# Patient Record
Sex: Female | Born: 1940 | Race: Black or African American | Hispanic: No | Smoking: Former smoker
Health system: Southern US, Community
[De-identification: ages and names within clinical notes are randomized; demographics above are authoritative.]

## PROBLEM LIST (undated history)

## (undated) DIAGNOSIS — I509 Heart failure, unspecified: Secondary | ICD-10-CM

## (undated) DIAGNOSIS — E785 Hyperlipidemia, unspecified: Secondary | ICD-10-CM

## (undated) DIAGNOSIS — E119 Type 2 diabetes mellitus without complications: Secondary | ICD-10-CM

## (undated) DIAGNOSIS — I10 Essential (primary) hypertension: Secondary | ICD-10-CM

## (undated) DIAGNOSIS — I639 Cerebral infarction, unspecified: Secondary | ICD-10-CM

## (undated) HISTORY — DX: Type 2 diabetes mellitus without complications: E11.9

## (undated) HISTORY — DX: Heart failure, unspecified: I50.9

## (undated) HISTORY — DX: Cerebral infarction, unspecified: I63.9

## (undated) HISTORY — DX: Hyperlipidemia, unspecified: E78.5

## (undated) HISTORY — DX: Essential (primary) hypertension: I10

---

## 2001-12-25 ENCOUNTER — Ambulatory Visit (HOSPITAL_COMMUNITY): Admission: RE | Admit: 2001-12-25 | Discharge: 2001-12-25 | Payer: Self-pay

## 2001-12-25 ENCOUNTER — Encounter: Payer: Self-pay | Admitting: Family Medicine

## 2003-12-12 ENCOUNTER — Ambulatory Visit (HOSPITAL_COMMUNITY): Admission: RE | Admit: 2003-12-12 | Discharge: 2003-12-12 | Payer: Self-pay | Admitting: Family Medicine

## 2003-12-28 ENCOUNTER — Other Ambulatory Visit: Payer: Self-pay

## 2005-01-18 ENCOUNTER — Ambulatory Visit (HOSPITAL_COMMUNITY): Admission: RE | Admit: 2005-01-18 | Discharge: 2005-01-18 | Payer: Self-pay

## 2005-02-03 ENCOUNTER — Ambulatory Visit (HOSPITAL_COMMUNITY): Admission: RE | Admit: 2005-02-03 | Discharge: 2005-02-03 | Payer: Self-pay | Admitting: Hematology and Oncology

## 2006-01-25 ENCOUNTER — Ambulatory Visit (HOSPITAL_COMMUNITY): Admission: RE | Admit: 2006-01-25 | Discharge: 2006-01-25 | Payer: Self-pay | Admitting: Family Medicine

## 2007-01-30 ENCOUNTER — Ambulatory Visit (HOSPITAL_COMMUNITY): Admission: RE | Admit: 2007-01-30 | Discharge: 2007-01-30 | Payer: Self-pay | Admitting: Family Medicine

## 2007-08-23 ENCOUNTER — Ambulatory Visit (HOSPITAL_COMMUNITY): Admission: RE | Admit: 2007-08-23 | Discharge: 2007-08-23 | Payer: Self-pay | Admitting: Family Medicine

## 2007-08-23 ENCOUNTER — Encounter: Payer: Self-pay | Admitting: Orthopedic Surgery

## 2007-08-28 ENCOUNTER — Ambulatory Visit: Payer: Self-pay | Admitting: Orthopedic Surgery

## 2007-08-28 DIAGNOSIS — Z8679 Personal history of other diseases of the circulatory system: Secondary | ICD-10-CM | POA: Insufficient documentation

## 2007-08-28 DIAGNOSIS — S8263XA Displaced fracture of lateral malleolus of unspecified fibula, initial encounter for closed fracture: Secondary | ICD-10-CM | POA: Insufficient documentation

## 2007-08-28 DIAGNOSIS — E119 Type 2 diabetes mellitus without complications: Secondary | ICD-10-CM

## 2007-10-02 ENCOUNTER — Ambulatory Visit: Payer: Self-pay | Admitting: Orthopedic Surgery

## 2007-11-27 ENCOUNTER — Ambulatory Visit: Payer: Self-pay | Admitting: Orthopedic Surgery

## 2008-02-01 ENCOUNTER — Ambulatory Visit (HOSPITAL_COMMUNITY): Admission: RE | Admit: 2008-02-01 | Discharge: 2008-02-01 | Payer: Self-pay | Admitting: Family Medicine

## 2009-02-03 ENCOUNTER — Ambulatory Visit (HOSPITAL_COMMUNITY): Admission: RE | Admit: 2009-02-03 | Discharge: 2009-02-03 | Payer: Self-pay | Admitting: Family Medicine

## 2010-02-12 ENCOUNTER — Ambulatory Visit (HOSPITAL_COMMUNITY): Admission: RE | Admit: 2010-02-12 | Discharge: 2010-02-12 | Payer: Self-pay | Admitting: Family Medicine

## 2010-04-09 ENCOUNTER — Ambulatory Visit: Payer: Self-pay | Admitting: Cardiology

## 2010-04-09 ENCOUNTER — Encounter (INDEPENDENT_AMBULATORY_CARE_PROVIDER_SITE_OTHER): Payer: Self-pay | Admitting: Internal Medicine

## 2010-04-10 ENCOUNTER — Encounter (INDEPENDENT_AMBULATORY_CARE_PROVIDER_SITE_OTHER): Payer: Self-pay | Admitting: Internal Medicine

## 2010-04-18 ENCOUNTER — Emergency Department (HOSPITAL_COMMUNITY): Admission: EM | Admit: 2010-04-18 | Discharge: 2010-04-18 | Payer: Self-pay | Admitting: Emergency Medicine

## 2010-04-22 ENCOUNTER — Ambulatory Visit (HOSPITAL_COMMUNITY): Admission: RE | Admit: 2010-04-22 | Discharge: 2010-04-22 | Payer: Self-pay | Admitting: Interventional Radiology

## 2010-04-23 ENCOUNTER — Inpatient Hospital Stay (HOSPITAL_COMMUNITY): Admission: EM | Admit: 2010-04-23 | Discharge: 2010-04-26 | Payer: Self-pay | Admitting: Emergency Medicine

## 2010-06-11 ENCOUNTER — Emergency Department (HOSPITAL_COMMUNITY): Admission: EM | Admit: 2010-06-11 | Discharge: 2010-06-11 | Payer: Self-pay | Admitting: Emergency Medicine

## 2010-06-14 ENCOUNTER — Inpatient Hospital Stay (HOSPITAL_COMMUNITY): Admission: AC | Admit: 2010-06-14 | Discharge: 2010-06-26 | Payer: Self-pay

## 2010-06-15 ENCOUNTER — Ambulatory Visit: Payer: Self-pay | Admitting: Vascular Surgery

## 2010-06-15 ENCOUNTER — Encounter (INDEPENDENT_AMBULATORY_CARE_PROVIDER_SITE_OTHER): Payer: Self-pay | Admitting: Internal Medicine

## 2010-06-24 ENCOUNTER — Encounter (INDEPENDENT_AMBULATORY_CARE_PROVIDER_SITE_OTHER): Payer: Self-pay | Admitting: Internal Medicine

## 2010-07-19 ENCOUNTER — Emergency Department: Payer: Self-pay | Admitting: Internal Medicine

## 2010-07-20 ENCOUNTER — Ambulatory Visit (HOSPITAL_COMMUNITY): Admission: RE | Admit: 2010-07-20 | Discharge: 2010-07-20 | Payer: Self-pay | Admitting: Geriatric Medicine

## 2010-07-21 ENCOUNTER — Emergency Department (HOSPITAL_COMMUNITY): Admission: EM | Admit: 2010-07-21 | Discharge: 2010-07-21 | Payer: Self-pay | Admitting: Emergency Medicine

## 2010-09-24 ENCOUNTER — Inpatient Hospital Stay (HOSPITAL_COMMUNITY): Admission: EM | Admit: 2010-09-24 | Discharge: 2010-04-14 | Payer: Self-pay | Admitting: Emergency Medicine

## 2010-10-31 ENCOUNTER — Emergency Department (HOSPITAL_COMMUNITY)
Admission: EM | Admit: 2010-10-31 | Discharge: 2010-10-31 | Payer: Self-pay | Source: Home / Self Care | Admitting: Emergency Medicine

## 2010-11-07 ENCOUNTER — Encounter: Payer: Self-pay | Admitting: Geriatric Medicine

## 2010-11-15 ENCOUNTER — Emergency Department (HOSPITAL_COMMUNITY)
Admission: EM | Admit: 2010-11-15 | Discharge: 2010-11-15 | Payer: Self-pay | Source: Home / Self Care | Admitting: Emergency Medicine

## 2010-11-15 LAB — COMPREHENSIVE METABOLIC PANEL
ALT: 73 U/L — ABNORMAL HIGH (ref 0–35)
Albumin: 3.3 g/dL — ABNORMAL LOW (ref 3.5–5.2)
Alkaline Phosphatase: 60 U/L (ref 39–117)
BUN: 37 mg/dL — ABNORMAL HIGH (ref 6–23)
CO2: 29 mEq/L (ref 19–32)
GFR calc non Af Amer: 47 mL/min — ABNORMAL LOW (ref >60)
Potassium: 4.1 mEq/L (ref 3.5–5.1)
Sodium: 138 mEq/L (ref 135–145)
Total Protein: 6.8 g/dL (ref 6.0–8.3)

## 2010-11-15 LAB — DIFFERENTIAL
Basophils Absolute: 0 10*3/uL (ref 0.0–0.1)
Eosinophils Absolute: 0.2 10*3/uL (ref 0.0–0.7)
Lymphs Abs: 1.8 10*3/uL (ref 0.7–4.0)
Monocytes Absolute: 0.6 10*3/uL (ref 0.1–1.0)
Monocytes Relative: 9 % (ref 3–12)
Neutrophils Relative %: 59 % (ref 43–77)

## 2010-11-15 LAB — CBC
Hemoglobin: 10.5 g/dL — ABNORMAL LOW (ref 12.0–15.0)
MCHC: 32.8 g/dL (ref 30.0–36.0)
MCV: 77.7 fL — ABNORMAL LOW (ref 78.0–100.0)
RBC: 4.12 MIL/uL (ref 3.87–5.11)
WBC: 6.6 10*3/uL (ref 4.0–10.5)

## 2010-11-15 LAB — LIPASE, BLOOD: Lipase: 64 U/L — ABNORMAL HIGH (ref 11–59)

## 2010-12-02 ENCOUNTER — Ambulatory Visit: Payer: Self-pay

## 2010-12-31 LAB — COMPREHENSIVE METABOLIC PANEL
ALT: 25 U/L (ref 0–35)
Albumin: 2.7 g/dL — ABNORMAL LOW (ref 3.5–5.2)
Alkaline Phosphatase: 80 U/L (ref 39–117)
Glucose, Bld: 185 mg/dL — ABNORMAL HIGH (ref 70–99)
Potassium: 4.1 mEq/L (ref 3.5–5.1)
Sodium: 144 mEq/L (ref 135–145)
Total Protein: 6.6 g/dL (ref 6.0–8.3)

## 2010-12-31 LAB — BASIC METABOLIC PANEL
BUN: 34 mg/dL — ABNORMAL HIGH (ref 6–23)
BUN: 50 mg/dL — ABNORMAL HIGH (ref 6–23)
CO2: 28 mEq/L (ref 19–32)
CO2: 28 mEq/L (ref 19–32)
CO2: 29 mEq/L (ref 19–32)
Calcium: 9.2 mg/dL (ref 8.4–10.5)
Calcium: 9.2 mg/dL (ref 8.4–10.5)
Calcium: 9.5 mg/dL (ref 8.4–10.5)
Chloride: 109 mEq/L (ref 96–112)
Chloride: 109 mEq/L (ref 96–112)
Chloride: 109 mEq/L (ref 96–112)
Creatinine, Ser: 1.53 mg/dL — ABNORMAL HIGH (ref 0.4–1.2)
Creatinine, Ser: 1.62 mg/dL — ABNORMAL HIGH (ref 0.4–1.2)
GFR calc Af Amer: 38 mL/min — ABNORMAL LOW (ref >60)
GFR calc Af Amer: 51 mL/min — ABNORMAL LOW (ref >60)
GFR calc Af Amer: >60 mL/min (ref >60)
GFR calc non Af Amer: 34 mL/min — ABNORMAL LOW (ref >60)
GFR calc non Af Amer: 50 mL/min — ABNORMAL LOW (ref >60)
Glucose, Bld: 247 mg/dL — ABNORMAL HIGH (ref 70–99)
Glucose, Bld: 263 mg/dL — ABNORMAL HIGH (ref 70–99)
Potassium: 3.8 mEq/L (ref 3.5–5.1)
Potassium: 3.9 mEq/L (ref 3.5–5.1)
Potassium: 4 mEq/L (ref 3.5–5.1)
Potassium: 4 mEq/L (ref 3.5–5.1)
Sodium: 147 mEq/L — ABNORMAL HIGH (ref 135–145)
Sodium: 149 mEq/L — ABNORMAL HIGH (ref 135–145)
Sodium: 150 mEq/L — ABNORMAL HIGH (ref 135–145)

## 2010-12-31 LAB — GLUCOSE, CAPILLARY
Glucose-Capillary: 139 mg/dL — ABNORMAL HIGH (ref 70–99)
Glucose-Capillary: 144 mg/dL — ABNORMAL HIGH (ref 70–99)
Glucose-Capillary: 148 mg/dL — ABNORMAL HIGH (ref 70–99)
Glucose-Capillary: 155 mg/dL — ABNORMAL HIGH (ref 70–99)
Glucose-Capillary: 157 mg/dL — ABNORMAL HIGH (ref 70–99)
Glucose-Capillary: 158 mg/dL — ABNORMAL HIGH (ref 70–99)
Glucose-Capillary: 173 mg/dL — ABNORMAL HIGH (ref 70–99)
Glucose-Capillary: 174 mg/dL — ABNORMAL HIGH (ref 70–99)
Glucose-Capillary: 177 mg/dL — ABNORMAL HIGH (ref 70–99)
Glucose-Capillary: 183 mg/dL — ABNORMAL HIGH (ref 70–99)
Glucose-Capillary: 198 mg/dL — ABNORMAL HIGH (ref 70–99)
Glucose-Capillary: 206 mg/dL — ABNORMAL HIGH (ref 70–99)
Glucose-Capillary: 208 mg/dL — ABNORMAL HIGH (ref 70–99)
Glucose-Capillary: 221 mg/dL — ABNORMAL HIGH (ref 70–99)
Glucose-Capillary: 223 mg/dL — ABNORMAL HIGH (ref 70–99)
Glucose-Capillary: 226 mg/dL — ABNORMAL HIGH (ref 70–99)
Glucose-Capillary: 232 mg/dL — ABNORMAL HIGH (ref 70–99)
Glucose-Capillary: 232 mg/dL — ABNORMAL HIGH (ref 70–99)
Glucose-Capillary: 232 mg/dL — ABNORMAL HIGH (ref 70–99)
Glucose-Capillary: 261 mg/dL — ABNORMAL HIGH (ref 70–99)
Glucose-Capillary: 263 mg/dL — ABNORMAL HIGH (ref 70–99)
Glucose-Capillary: 267 mg/dL — ABNORMAL HIGH (ref 70–99)
Glucose-Capillary: 268 mg/dL — ABNORMAL HIGH (ref 70–99)
Glucose-Capillary: 292 mg/dL — ABNORMAL HIGH (ref 70–99)
Glucose-Capillary: 319 mg/dL — ABNORMAL HIGH (ref 70–99)
Glucose-Capillary: 342 mg/dL — ABNORMAL HIGH (ref 70–99)

## 2010-12-31 LAB — CBC
HCT: 33.1 % — ABNORMAL LOW (ref 36.0–46.0)
HCT: 36 % (ref 36.0–46.0)
HCT: 36.2 % (ref 36.0–46.0)
HCT: 36.4 % (ref 36.0–46.0)
HCT: 38.5 % (ref 36.0–46.0)
Hemoglobin: 10.4 g/dL — ABNORMAL LOW (ref 12.0–15.0)
Hemoglobin: 11.7 g/dL — ABNORMAL LOW (ref 12.0–15.0)
MCH: 24.1 pg — ABNORMAL LOW (ref 26.0–34.0)
MCH: 24.2 pg — ABNORMAL LOW (ref 26.0–34.0)
MCHC: 30.7 g/dL (ref 30.0–36.0)
MCHC: 31.4 g/dL (ref 30.0–36.0)
MCV: 77.6 fL — ABNORMAL LOW (ref 78.0–100.0)
MCV: 79 fL (ref 78.0–100.0)
Platelets: 254 10*3/uL (ref 150–400)
Platelets: 271 10*3/uL (ref 150–400)
Platelets: 291 10*3/uL (ref 150–400)
RBC: 4.11 MIL/uL (ref 3.87–5.11)
RBC: 4.31 MIL/uL (ref 3.87–5.11)
RBC: 4.82 MIL/uL (ref 3.87–5.11)
RDW: 18 % — ABNORMAL HIGH (ref 11.5–15.5)
RDW: 18.7 % — ABNORMAL HIGH (ref 11.5–15.5)
RDW: 18.7 % — ABNORMAL HIGH (ref 11.5–15.5)
WBC: 10.7 10*3/uL — ABNORMAL HIGH (ref 4.0–10.5)
WBC: 11.6 10*3/uL — ABNORMAL HIGH (ref 4.0–10.5)
WBC: 9.7 10*3/uL (ref 4.0–10.5)

## 2010-12-31 LAB — IRON AND TIBC
Saturation Ratios: 11 % — ABNORMAL LOW (ref 20–55)
TIBC: 238 ug/dL — ABNORMAL LOW (ref 250–470)

## 2010-12-31 LAB — FOLATE: Folate: >20 ng/mL

## 2010-12-31 LAB — BRAIN NATRIURETIC PEPTIDE
Pro B Natriuretic peptide (BNP): >3200 pg/mL — ABNORMAL HIGH (ref 0.0–100.0)
Pro B Natriuretic peptide (BNP): >3200 pg/mL — ABNORMAL HIGH (ref 0.0–100.0)

## 2010-12-31 LAB — PROTIME-INR: Prothrombin Time: 15 seconds (ref 11.6–15.2)

## 2011-01-01 LAB — GLUCOSE, CAPILLARY
Glucose-Capillary: 113 mg/dL — ABNORMAL HIGH (ref 70–99)
Glucose-Capillary: 121 mg/dL — ABNORMAL HIGH (ref 70–99)
Glucose-Capillary: 128 mg/dL — ABNORMAL HIGH (ref 70–99)
Glucose-Capillary: 136 mg/dL — ABNORMAL HIGH (ref 70–99)
Glucose-Capillary: 138 mg/dL — ABNORMAL HIGH (ref 70–99)
Glucose-Capillary: 152 mg/dL — ABNORMAL HIGH (ref 70–99)
Glucose-Capillary: 166 mg/dL — ABNORMAL HIGH (ref 70–99)
Glucose-Capillary: 166 mg/dL — ABNORMAL HIGH (ref 70–99)
Glucose-Capillary: 184 mg/dL — ABNORMAL HIGH (ref 70–99)
Glucose-Capillary: 187 mg/dL — ABNORMAL HIGH (ref 70–99)
Glucose-Capillary: 252 mg/dL — ABNORMAL HIGH (ref 70–99)

## 2011-01-01 LAB — CBC
HCT: 27.4 % — ABNORMAL LOW (ref 36.0–46.0)
HCT: 30 % — ABNORMAL LOW (ref 36.0–46.0)
Hemoglobin: 8.5 g/dL — ABNORMAL LOW (ref 12.0–15.0)
Hemoglobin: 9.2 g/dL — ABNORMAL LOW (ref 12.0–15.0)
Hemoglobin: 9.9 g/dL — ABNORMAL LOW (ref 12.0–15.0)
MCH: 23.8 pg — ABNORMAL LOW (ref 26.0–34.0)
MCH: 24.1 pg — ABNORMAL LOW (ref 26.0–34.0)
MCHC: 31.6 g/dL (ref 30.0–36.0)
MCHC: 31 g/dL (ref 30.0–36.0)
MCHC: 31 g/dL (ref 30.0–36.0)
MCV: 77.8 fL — ABNORMAL LOW (ref 78.0–100.0)
MCV: 78 fL (ref 78.0–100.0)
Platelets: 159 10*3/uL (ref 150–400)
Platelets: 160 10*3/uL (ref 150–400)
Platelets: 226 10*3/uL (ref 150–400)
RBC: 3.82 MIL/uL — ABNORMAL LOW (ref 3.87–5.11)
RDW: 18.2 % — ABNORMAL HIGH (ref 11.5–15.5)
RDW: 17.5 % — ABNORMAL HIGH (ref 11.5–15.5)
RDW: 17.7 % — ABNORMAL HIGH (ref 11.5–15.5)
RDW: 17.8 % — ABNORMAL HIGH (ref 11.5–15.5)
WBC: 7.7 10*3/uL (ref 4.0–10.5)
WBC: 7.4 10*3/uL (ref 4.0–10.5)
WBC: 8.4 10*3/uL (ref 4.0–10.5)

## 2011-01-01 LAB — COMPREHENSIVE METABOLIC PANEL
ALT: 14 U/L (ref 0–35)
Albumin: 3.2 g/dL — ABNORMAL LOW (ref 3.5–5.2)
BUN: 23 mg/dL (ref 6–23)
CO2: 22 meq/L (ref 19–32)
CO2: 23 mEq/L (ref 19–32)
Calcium: 8.6 mg/dL (ref 8.4–10.5)
Chloride: 106 mEq/L (ref 96–112)
Chloride: 107 meq/L (ref 96–112)
Creatinine, Ser: 1.42 mg/dL — ABNORMAL HIGH (ref 0.4–1.2)
GFR calc non Af Amer: 37 mL/min — ABNORMAL LOW (ref >60)
GFR calc non Af Amer: 39 mL/min — ABNORMAL LOW (ref >60)
Glucose, Bld: 120 mg/dL — ABNORMAL HIGH (ref 70–99)
Glucose, Bld: 140 mg/dL — ABNORMAL HIGH (ref 70–99)
Sodium: 136 meq/L (ref 135–145)
Total Bilirubin: 0.4 mg/dL (ref 0.3–1.2)
Total Bilirubin: 0.6 mg/dL (ref 0.3–1.2)

## 2011-01-01 LAB — TROPONIN I: Troponin I: 0.01 ng/mL (ref 0.00–0.06)

## 2011-01-01 LAB — BASIC METABOLIC PANEL
BUN: 23 mg/dL (ref 6–23)
BUN: 35 mg/dL — ABNORMAL HIGH (ref 6–23)
CO2: 20 mEq/L (ref 19–32)
Calcium: 9.2 mg/dL (ref 8.4–10.5)
Calcium: 8.8 mg/dL (ref 8.4–10.5)
Chloride: 115 mEq/L — ABNORMAL HIGH (ref 96–112)
Creatinine, Ser: 1.41 mg/dL — ABNORMAL HIGH (ref 0.4–1.2)
GFR calc Af Amer: 39 mL/min — ABNORMAL LOW (ref >60)
GFR calc non Af Amer: 37 mL/min — ABNORMAL LOW (ref >60)
GFR calc non Af Amer: 33 mL/min — ABNORMAL LOW (ref >60)
GFR calc non Af Amer: 37 mL/min — ABNORMAL LOW (ref >60)
Glucose, Bld: 140 mg/dL — ABNORMAL HIGH (ref 70–99)
Glucose, Bld: 181 mg/dL — ABNORMAL HIGH (ref 70–99)
Potassium: 4.1 mEq/L (ref 3.5–5.1)
Potassium: 3.9 mEq/L (ref 3.5–5.1)
Potassium: 4.1 mEq/L (ref 3.5–5.1)
Sodium: 144 mEq/L (ref 135–145)
Sodium: 149 mEq/L — ABNORMAL HIGH (ref 135–145)

## 2011-01-01 LAB — DIFFERENTIAL
Basophils Absolute: 0 10*3/uL (ref 0.0–0.1)
Basophils Absolute: 0 10*3/uL (ref 0.0–0.1)
Basophils Absolute: 0 10*3/uL (ref 0.0–0.1)
Basophils Relative: 0 % (ref 0–1)
Basophils Relative: 0 % (ref 0–1)
Eosinophils Absolute: 0.1 10*3/uL (ref 0.0–0.7)
Lymphocytes Relative: 18 % (ref 12–46)
Lymphocytes Relative: 28 % (ref 12–46)
Monocytes Absolute: 0.7 10*3/uL (ref 0.1–1.0)
Monocytes Relative: 8 % (ref 3–12)
Neutro Abs: 5.9 10*3/uL (ref 1.7–7.7)
Neutro Abs: 5.3 10*3/uL (ref 1.7–7.7)
Neutrophils Relative %: 76 % (ref 43–77)
Neutrophils Relative %: 63 % (ref 43–77)

## 2011-01-01 LAB — PROTIME-INR
INR: 1.07 (ref 0.00–1.49)
Prothrombin Time: 14.7 seconds (ref 11.6–15.2)

## 2011-01-01 LAB — URINALYSIS, ROUTINE W REFLEX MICROSCOPIC
Bilirubin Urine: NEGATIVE
Ketones, ur: 15 mg/dL — AB
Nitrite: NEGATIVE
Protein, ur: 100 mg/dL — AB
Specific Gravity, Urine: 1.041 — ABNORMAL HIGH (ref 1.005–1.030)
Urobilinogen, UA: 1 mg/dL (ref 0.0–1.0)

## 2011-01-01 LAB — APTT: aPTT: 35 seconds (ref 24–37)

## 2011-01-01 LAB — LIPID PANEL
Cholesterol: 139 mg/dL (ref 0–200)
HDL: 31 mg/dL — ABNORMAL LOW (ref >39)
LDL Cholesterol: 91 mg/dL (ref 0–99)
Total CHOL/HDL Ratio: 4.5 RATIO
Triglycerides: 87 mg/dL (ref <150)
VLDL: 17 mg/dL (ref 0–40)

## 2011-01-01 LAB — CK TOTAL AND CKMB (NOT AT ARMC)
Relative Index: 1 (ref 0.0–2.5)
Total CK: 155 U/L (ref 7–177)

## 2011-01-01 LAB — HEMOGLOBIN A1C: Hgb A1c MFr Bld: 8.4 % — ABNORMAL HIGH (ref <5.7)

## 2011-01-01 LAB — POCT I-STAT, CHEM 8
Creatinine, Ser: 1.6 mg/dL — ABNORMAL HIGH (ref 0.4–1.2)
Glucose, Bld: 118 mg/dL — ABNORMAL HIGH (ref 70–99)
Hemoglobin: 11.6 g/dL — ABNORMAL LOW (ref 12.0–15.0)
TCO2: 24 mmol/L (ref 0–100)

## 2011-01-01 LAB — MRSA PCR SCREENING: MRSA by PCR: NEGATIVE

## 2011-01-03 LAB — GLUCOSE, CAPILLARY
Glucose-Capillary: 144 mg/dL — ABNORMAL HIGH (ref 70–99)
Glucose-Capillary: 151 mg/dL — ABNORMAL HIGH (ref 70–99)
Glucose-Capillary: 151 mg/dL — ABNORMAL HIGH (ref 70–99)
Glucose-Capillary: 152 mg/dL — ABNORMAL HIGH (ref 70–99)
Glucose-Capillary: 153 mg/dL — ABNORMAL HIGH (ref 70–99)
Glucose-Capillary: 155 mg/dL — ABNORMAL HIGH (ref 70–99)
Glucose-Capillary: 165 mg/dL — ABNORMAL HIGH (ref 70–99)
Glucose-Capillary: 174 mg/dL — ABNORMAL HIGH (ref 70–99)
Glucose-Capillary: 178 mg/dL — ABNORMAL HIGH (ref 70–99)
Glucose-Capillary: 180 mg/dL — ABNORMAL HIGH (ref 70–99)
Glucose-Capillary: 182 mg/dL — ABNORMAL HIGH (ref 70–99)
Glucose-Capillary: 187 mg/dL — ABNORMAL HIGH (ref 70–99)
Glucose-Capillary: 193 mg/dL — ABNORMAL HIGH (ref 70–99)
Glucose-Capillary: 200 mg/dL — ABNORMAL HIGH (ref 70–99)
Glucose-Capillary: 203 mg/dL — ABNORMAL HIGH (ref 70–99)
Glucose-Capillary: 204 mg/dL — ABNORMAL HIGH (ref 70–99)
Glucose-Capillary: 206 mg/dL — ABNORMAL HIGH (ref 70–99)
Glucose-Capillary: 206 mg/dL — ABNORMAL HIGH (ref 70–99)
Glucose-Capillary: 212 mg/dL — ABNORMAL HIGH (ref 70–99)
Glucose-Capillary: 213 mg/dL — ABNORMAL HIGH (ref 70–99)
Glucose-Capillary: 220 mg/dL — ABNORMAL HIGH (ref 70–99)

## 2011-01-03 LAB — COMPREHENSIVE METABOLIC PANEL
ALT: 43 U/L — ABNORMAL HIGH (ref 0–35)
AST: 25 U/L (ref 0–37)
Albumin: 3.1 g/dL — ABNORMAL LOW (ref 3.5–5.2)
Alkaline Phosphatase: 70 U/L (ref 39–117)
BUN: 22 mg/dL (ref 6–23)
BUN: 24 mg/dL — ABNORMAL HIGH (ref 6–23)
CO2: 23 mEq/L (ref 19–32)
CO2: 26 mEq/L (ref 19–32)
Calcium: 8.5 mg/dL (ref 8.4–10.5)
Calcium: 8.6 mg/dL (ref 8.4–10.5)
Calcium: 8.6 mg/dL (ref 8.4–10.5)
Chloride: 107 mEq/L (ref 96–112)
Creatinine, Ser: 1.22 mg/dL — ABNORMAL HIGH (ref 0.4–1.2)
Creatinine, Ser: 1.28 mg/dL — ABNORMAL HIGH (ref 0.4–1.2)
GFR calc Af Amer: 53 mL/min — ABNORMAL LOW (ref >60)
GFR calc non Af Amer: 41 mL/min — ABNORMAL LOW (ref >60)
GFR calc non Af Amer: 44 mL/min — ABNORMAL LOW (ref >60)
Glucose, Bld: 128 mg/dL — ABNORMAL HIGH (ref 70–99)
Glucose, Bld: 153 mg/dL — ABNORMAL HIGH (ref 70–99)
Glucose, Bld: 166 mg/dL — ABNORMAL HIGH (ref 70–99)
Potassium: 3.8 mEq/L (ref 3.5–5.1)
Sodium: 139 mEq/L (ref 135–145)
Sodium: 145 mEq/L (ref 135–145)
Total Bilirubin: 0.8 mg/dL (ref 0.3–1.2)
Total Protein: 5.9 g/dL — ABNORMAL LOW (ref 6.0–8.3)
Total Protein: 6.4 g/dL (ref 6.0–8.3)

## 2011-01-03 LAB — POCT I-STAT 3, ART BLOOD GAS (G3+)
Patient temperature: 98
pCO2 arterial: 34.4 mmHg — ABNORMAL LOW (ref 35.0–45.0)
pH, Arterial: 7.369 (ref 7.350–7.400)

## 2011-01-03 LAB — CBC
HCT: 34.4 % — ABNORMAL LOW (ref 36.0–46.0)
HCT: 34.7 % — ABNORMAL LOW (ref 36.0–46.0)
HCT: 36.9 % (ref 36.0–46.0)
HCT: 38.3 % (ref 36.0–46.0)
HCT: 38.4 % (ref 36.0–46.0)
HCT: 43.7 % (ref 36.0–46.0)
Hemoglobin: 10.8 g/dL — ABNORMAL LOW (ref 12.0–15.0)
Hemoglobin: 12 g/dL (ref 12.0–15.0)
Hemoglobin: 12.2 g/dL (ref 12.0–15.0)
Hemoglobin: 12.3 g/dL (ref 12.0–15.0)
Hemoglobin: 13.7 g/dL (ref 12.0–15.0)
MCH: 25.9 pg — ABNORMAL LOW (ref 26.0–34.0)
MCH: 26.1 pg (ref 26.0–34.0)
MCH: 26.6 pg (ref 26.0–34.0)
MCHC: 31.3 g/dL (ref 30.0–36.0)
MCHC: 31.6 g/dL (ref 30.0–36.0)
MCHC: 31.9 g/dL (ref 30.0–36.0)
MCHC: 32.2 g/dL (ref 30.0–36.0)
MCHC: 32.2 g/dL (ref 30.0–36.0)
MCV: 80.3 fL (ref 78.0–100.0)
MCV: 80.4 fL (ref 78.0–100.0)
MCV: 81.8 fL (ref 78.0–100.0)
MCV: 82.3 fL (ref 78.0–100.0)
MCV: 82.5 fL (ref 78.0–100.0)
Platelets: 144 10*3/uL — ABNORMAL LOW (ref 150–400)
Platelets: 150 10*3/uL (ref 150–400)
Platelets: 181 10*3/uL (ref 150–400)
RBC: 4.57 MIL/uL (ref 3.87–5.11)
RBC: 4.73 MIL/uL (ref 3.87–5.11)
RBC: 4.77 MIL/uL (ref 3.87–5.11)
RBC: 4.78 MIL/uL (ref 3.87–5.11)
RDW: 17.7 % — ABNORMAL HIGH (ref 11.5–15.5)
RDW: 18.1 % — ABNORMAL HIGH (ref 11.5–15.5)
RDW: 18.6 % — ABNORMAL HIGH (ref 11.5–15.5)
RDW: 18.6 % — ABNORMAL HIGH (ref 11.5–15.5)
WBC: 6.1 10*3/uL (ref 4.0–10.5)
WBC: 6.6 10*3/uL (ref 4.0–10.5)
WBC: 6.7 10*3/uL (ref 4.0–10.5)
WBC: 7.4 10*3/uL (ref 4.0–10.5)
WBC: 8 10*3/uL (ref 4.0–10.5)

## 2011-01-03 LAB — BASIC METABOLIC PANEL
BUN: 16 mg/dL (ref 6–23)
BUN: 22 mg/dL (ref 6–23)
BUN: 25 mg/dL — ABNORMAL HIGH (ref 6–23)
CO2: 25 mEq/L (ref 19–32)
CO2: 25 mEq/L (ref 19–32)
CO2: 25 mEq/L (ref 19–32)
CO2: 31 mEq/L (ref 19–32)
Calcium: 8.5 mg/dL (ref 8.4–10.5)
Calcium: 8.7 mg/dL (ref 8.4–10.5)
Calcium: 8.9 mg/dL (ref 8.4–10.5)
Calcium: 9 mg/dL (ref 8.4–10.5)
Calcium: 9.3 mg/dL (ref 8.4–10.5)
Chloride: 101 mEq/L (ref 96–112)
Chloride: 105 mEq/L (ref 96–112)
Chloride: 106 mEq/L (ref 96–112)
Chloride: 108 mEq/L (ref 96–112)
Chloride: 109 mEq/L (ref 96–112)
Chloride: 98 mEq/L (ref 96–112)
Creatinine, Ser: 1.12 mg/dL (ref 0.4–1.2)
Creatinine, Ser: 1.23 mg/dL — ABNORMAL HIGH (ref 0.4–1.2)
Creatinine, Ser: 1.25 mg/dL — ABNORMAL HIGH (ref 0.4–1.2)
Creatinine, Ser: 1.26 mg/dL — ABNORMAL HIGH (ref 0.4–1.2)
GFR calc Af Amer: 38 mL/min — ABNORMAL LOW (ref >60)
GFR calc Af Amer: 51 mL/min — ABNORMAL LOW (ref >60)
GFR calc Af Amer: 51 mL/min — ABNORMAL LOW (ref >60)
GFR calc Af Amer: 52 mL/min — ABNORMAL LOW (ref >60)
GFR calc Af Amer: 52 mL/min — ABNORMAL LOW (ref >60)
GFR calc Af Amer: 53 mL/min — ABNORMAL LOW (ref >60)
GFR calc Af Amer: 58 mL/min — ABNORMAL LOW (ref >60)
GFR calc non Af Amer: 36 mL/min — ABNORMAL LOW (ref >60)
GFR calc non Af Amer: 42 mL/min — ABNORMAL LOW (ref >60)
GFR calc non Af Amer: 42 mL/min — ABNORMAL LOW (ref >60)
GFR calc non Af Amer: 43 mL/min — ABNORMAL LOW (ref >60)
GFR calc non Af Amer: 43 mL/min — ABNORMAL LOW (ref >60)
GFR calc non Af Amer: 45 mL/min — ABNORMAL LOW (ref >60)
GFR calc non Af Amer: 49 mL/min — ABNORMAL LOW (ref >60)
Glucose, Bld: 141 mg/dL — ABNORMAL HIGH (ref 70–99)
Glucose, Bld: 147 mg/dL — ABNORMAL HIGH (ref 70–99)
Glucose, Bld: 166 mg/dL — ABNORMAL HIGH (ref 70–99)
Glucose, Bld: 171 mg/dL — ABNORMAL HIGH (ref 70–99)
Glucose, Bld: 186 mg/dL — ABNORMAL HIGH (ref 70–99)
Glucose, Bld: 235 mg/dL — ABNORMAL HIGH (ref 70–99)
Glucose, Bld: 236 mg/dL — ABNORMAL HIGH (ref 70–99)
Potassium: 3.5 mEq/L (ref 3.5–5.1)
Potassium: 3.6 mEq/L (ref 3.5–5.1)
Potassium: 3.7 mEq/L (ref 3.5–5.1)
Potassium: 4 mEq/L (ref 3.5–5.1)
Potassium: 4.1 mEq/L (ref 3.5–5.1)
Potassium: 4.1 mEq/L (ref 3.5–5.1)
Potassium: 4.6 mEq/L (ref 3.5–5.1)
Potassium: 4.6 mEq/L (ref 3.5–5.1)
Sodium: 137 mEq/L (ref 135–145)
Sodium: 139 mEq/L (ref 135–145)
Sodium: 139 mEq/L (ref 135–145)
Sodium: 139 mEq/L (ref 135–145)
Sodium: 140 mEq/L (ref 135–145)
Sodium: 140 mEq/L (ref 135–145)
Sodium: 144 mEq/L (ref 135–145)
Sodium: 145 mEq/L (ref 135–145)

## 2011-01-03 LAB — CARDIAC PANEL(CRET KIN+CKTOT+MB+TROPI)
CK, MB: 1.6 ng/mL (ref 0.3–4.0)
CK, MB: 1.7 ng/mL (ref 0.3–4.0)
CK, MB: 3.5 ng/mL (ref 0.3–4.0)
CK, MB: 4.2 ng/mL — ABNORMAL HIGH (ref 0.3–4.0)
Relative Index: 1.3 (ref 0.0–2.5)
Relative Index: 1.9 (ref 0.0–2.5)
Total CK: 222 U/L — ABNORMAL HIGH (ref 7–177)
Total CK: 225 U/L — ABNORMAL HIGH (ref 7–177)
Total CK: 73 U/L (ref 7–177)
Total CK: 80 U/L (ref 7–177)
Troponin I: 0.02 ng/mL (ref 0.00–0.06)
Troponin I: 0.02 ng/mL (ref 0.00–0.06)
Troponin I: 0.03 ng/mL (ref 0.00–0.06)
Troponin I: 0.03 ng/mL (ref 0.00–0.06)

## 2011-01-03 LAB — URINALYSIS, ROUTINE W REFLEX MICROSCOPIC
Glucose, UA: NEGATIVE mg/dL
Glucose, UA: NEGATIVE mg/dL
Hgb urine dipstick: NEGATIVE
Ketones, ur: NEGATIVE mg/dL
Ketones, ur: NEGATIVE mg/dL
Leukocytes, UA: NEGATIVE
Nitrite: NEGATIVE
Nitrite: NEGATIVE
Nitrite: NEGATIVE
Protein, ur: 100 mg/dL — AB
Protein, ur: 100 mg/dL — AB
Specific Gravity, Urine: 1.01 (ref 1.005–1.030)
Specific Gravity, Urine: 1.024 (ref 1.005–1.030)
Urobilinogen, UA: 1 mg/dL (ref 0.0–1.0)
Urobilinogen, UA: 1 mg/dL (ref 0.0–1.0)
pH: 6 (ref 5.0–8.0)
pH: 6 (ref 5.0–8.0)

## 2011-01-03 LAB — CK TOTAL AND CKMB (NOT AT ARMC)
CK, MB: 4.5 ng/mL — ABNORMAL HIGH (ref 0.3–4.0)
Relative Index: 1.9 (ref 0.0–2.5)
Relative Index: INVALID (ref 0.0–2.5)
Total CK: 232 U/L — ABNORMAL HIGH (ref 7–177)

## 2011-01-03 LAB — TROPONIN I
Troponin I: 0.02 ng/mL (ref 0.00–0.06)
Troponin I: 0.02 ng/mL (ref 0.00–0.06)

## 2011-01-03 LAB — DIFFERENTIAL
Basophils Absolute: 0 10*3/uL (ref 0.0–0.1)
Basophils Absolute: 0 10*3/uL (ref 0.0–0.1)
Basophils Absolute: 0.1 10*3/uL (ref 0.0–0.1)
Basophils Relative: 0 % (ref 0–1)
Basophils Relative: 1 % (ref 0–1)
Eosinophils Absolute: 0.2 10*3/uL (ref 0.0–0.7)
Eosinophils Relative: 3 % (ref 0–5)
Eosinophils Relative: 2 % (ref 0–5)
Lymphocytes Relative: 23 % (ref 12–46)
Lymphocytes Relative: 25 % (ref 12–46)
Lymphocytes Relative: 26 % (ref 12–46)
Lymphocytes Relative: 28 % (ref 12–46)
Lymphocytes Relative: 29 % (ref 12–46)
Lymphs Abs: 1.5 10*3/uL (ref 0.7–4.0)
Lymphs Abs: 1.5 10*3/uL (ref 0.7–4.0)
Monocytes Absolute: 0.6 10*3/uL (ref 0.1–1.0)
Monocytes Absolute: 0.6 10*3/uL (ref 0.1–1.0)
Monocytes Absolute: 0.7 10*3/uL (ref 0.1–1.0)
Monocytes Relative: 10 % (ref 3–12)
Monocytes Relative: 9 % (ref 3–12)
Neutro Abs: 3.8 10*3/uL (ref 1.7–7.7)
Neutro Abs: 4.2 10*3/uL (ref 1.7–7.7)
Neutro Abs: 4.3 10*3/uL (ref 1.7–7.7)
Neutro Abs: 4.7 10*3/uL (ref 1.7–7.7)
Neutrophils Relative %: 62 % (ref 43–77)

## 2011-01-03 LAB — POCT I-STAT, CHEM 8
BUN: 22 mg/dL (ref 6–23)
BUN: 32 mg/dL — ABNORMAL HIGH (ref 6–23)
Calcium, Ion: 1.16 mmol/L (ref 1.12–1.32)
Calcium, Ion: 1.1 mmol/L — ABNORMAL LOW (ref 1.12–1.32)
Chloride: 105 mEq/L (ref 96–112)
Chloride: 108 mEq/L (ref 96–112)
Chloride: 113 mEq/L — ABNORMAL HIGH (ref 96–112)
Creatinine, Ser: 1.6 mg/dL — ABNORMAL HIGH (ref 0.4–1.2)
Glucose, Bld: 144 mg/dL — ABNORMAL HIGH (ref 70–99)
HCT: 37 % (ref 36.0–46.0)
Potassium: 3.6 mEq/L (ref 3.5–5.1)
Potassium: 4 mEq/L (ref 3.5–5.1)
Sodium: 140 mEq/L (ref 135–145)
TCO2: 28 mmol/L (ref 0–100)

## 2011-01-03 LAB — URINE CULTURE
Colony Count: 25000
Colony Count: 7000

## 2011-01-03 LAB — TSH: TSH: 1.06 u[IU]/mL (ref 0.350–4.500)

## 2011-01-03 LAB — URINE MICROSCOPIC-ADD ON

## 2011-01-03 LAB — POCT CARDIAC MARKERS
Myoglobin, poc: 135 ng/mL (ref 12–200)
Troponin i, poc: <0.05 ng/mL (ref 0.00–0.09)

## 2011-01-03 LAB — RAPID URINE DRUG SCREEN, HOSP PERFORMED
Amphetamines: NOT DETECTED
Benzodiazepines: NOT DETECTED
Cocaine: NOT DETECTED
Tetrahydrocannabinol: NOT DETECTED

## 2011-01-03 LAB — LIPID PANEL
Cholesterol: 111 mg/dL (ref 0–200)
LDL Cholesterol: 62 mg/dL (ref 0–99)
Triglycerides: 87 mg/dL (ref <150)
VLDL: 17 mg/dL (ref 0–40)

## 2011-01-03 LAB — BRAIN NATRIURETIC PEPTIDE
Pro B Natriuretic peptide (BNP): 2582 pg/mL — ABNORMAL HIGH (ref 0.0–100.0)
Pro B Natriuretic peptide (BNP): >3200 pg/mL — ABNORMAL HIGH (ref 0.0–100.0)

## 2011-01-03 LAB — MRSA PCR SCREENING
MRSA by PCR: NEGATIVE
MRSA by PCR: NEGATIVE

## 2011-01-03 LAB — HEMOGLOBIN A1C: Hgb A1c MFr Bld: 6.9 % — ABNORMAL HIGH (ref <5.7)

## 2011-01-03 LAB — PROTIME-INR: INR: 1.3 (ref 0.00–1.49)

## 2011-01-03 LAB — APTT: aPTT: 31 seconds (ref 24–37)

## 2011-01-18 ENCOUNTER — Emergency Department: Payer: Self-pay | Admitting: Emergency Medicine

## 2011-02-10 ENCOUNTER — Ambulatory Visit: Payer: Self-pay

## 2011-02-13 ENCOUNTER — Emergency Department: Payer: Self-pay | Admitting: Emergency Medicine

## 2011-03-30 IMAGING — XA IR ANGIO/CAROTID/CERV BI
1 series · 12 of 24 positions shown · IV contrast (IODINE)
Comparison: MRI MRA of the brain of 04/09/2010.

CLINICAL DATA: History of right frontal ischemic stroke.  Previous
history of subarachnoid hemorrhage in the past.  Abnormal MRA
examination of brain.

BILATERAL CAROTID ARTERIOGRAPHY AND BILATERAL VERTEBRAL ARTERY
ANGIOGRAMS

[Series 300: neuro · 12 of 158 slices shown]
[im 7/158]
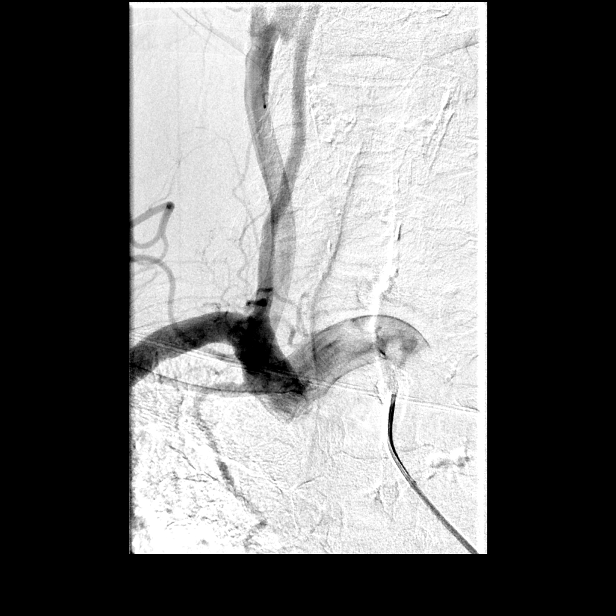
[im 21/158]
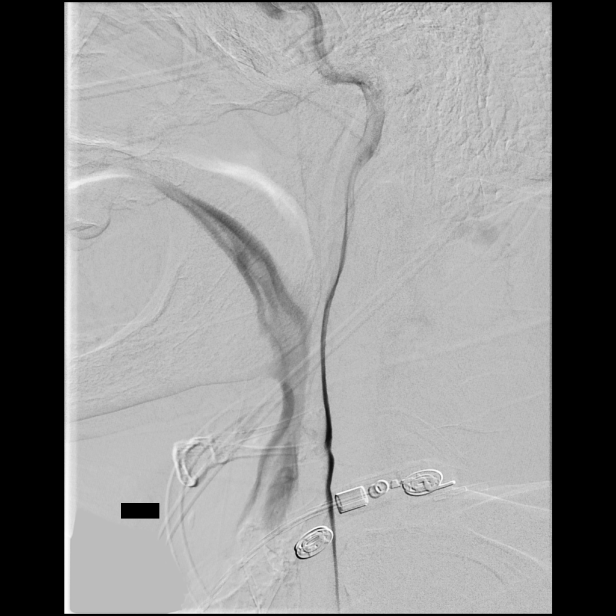
[im 35/158]
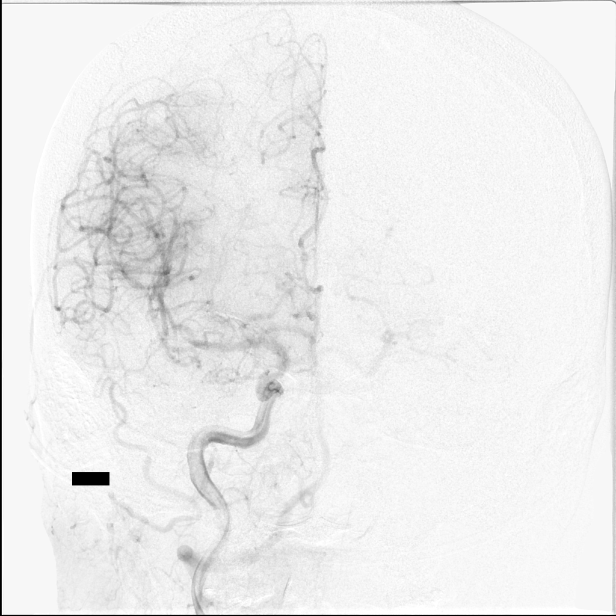
[im 48/158]
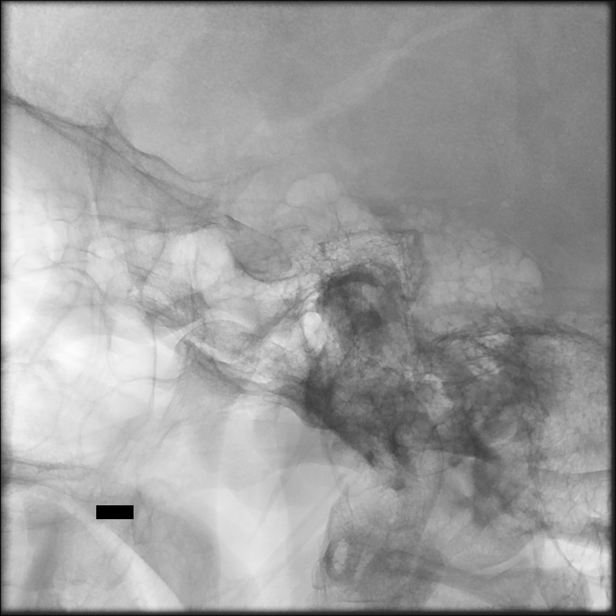
[im 62/158]
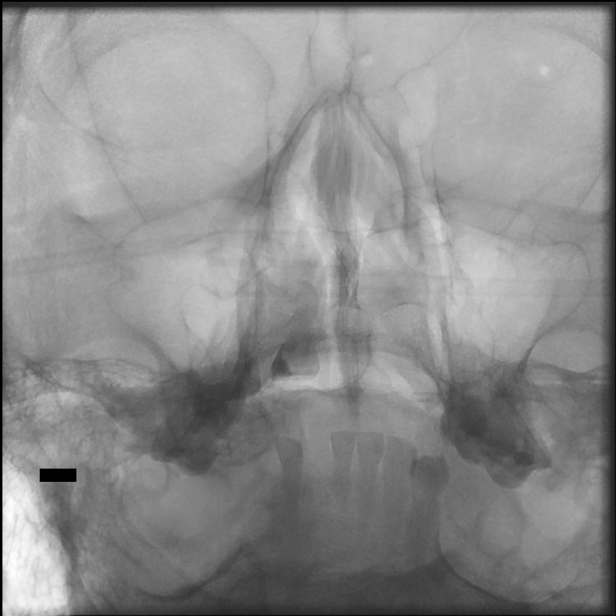
[im 76/158]
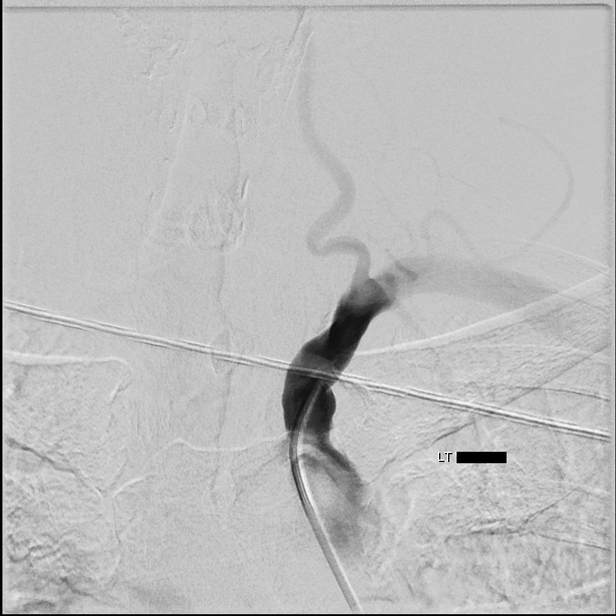
[im 89/158]
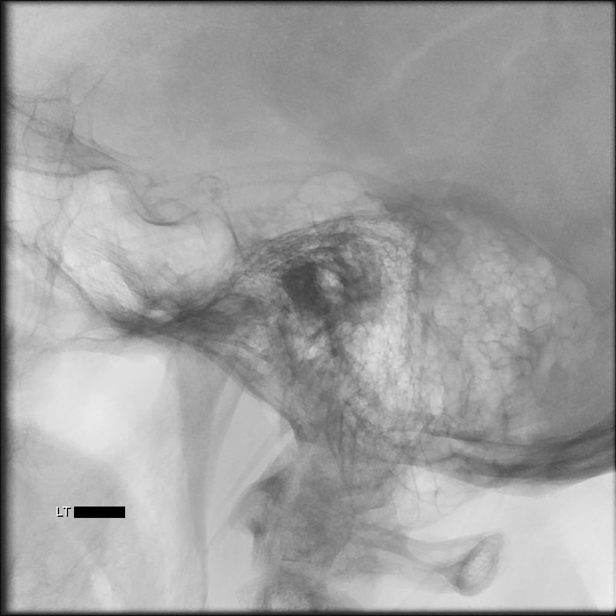
[im 103/158]
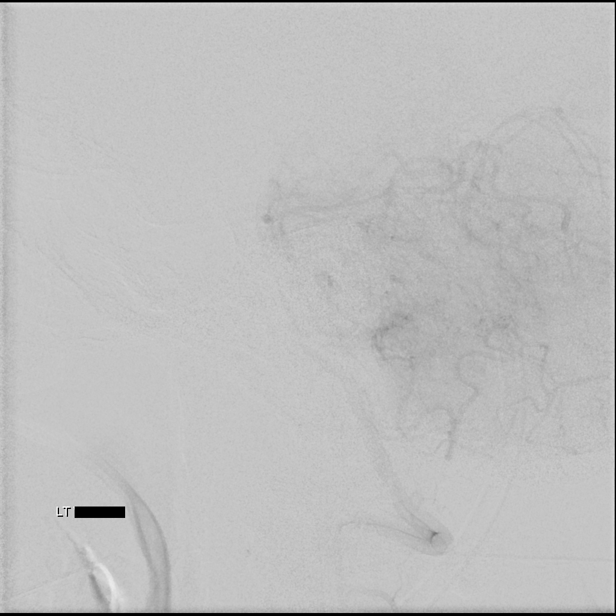
[im 117/158]
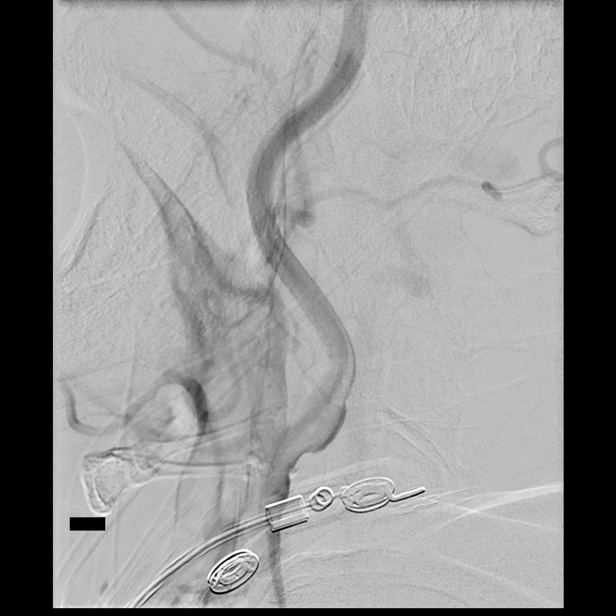
[im 130/158]
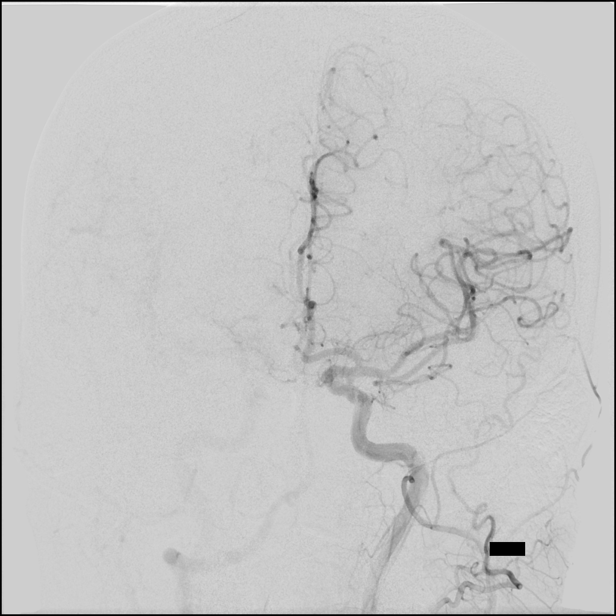
[im 144/158]
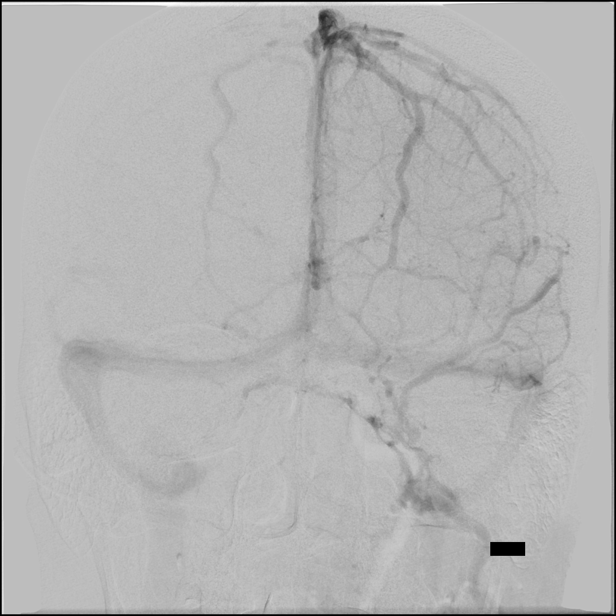
[im 158/158]
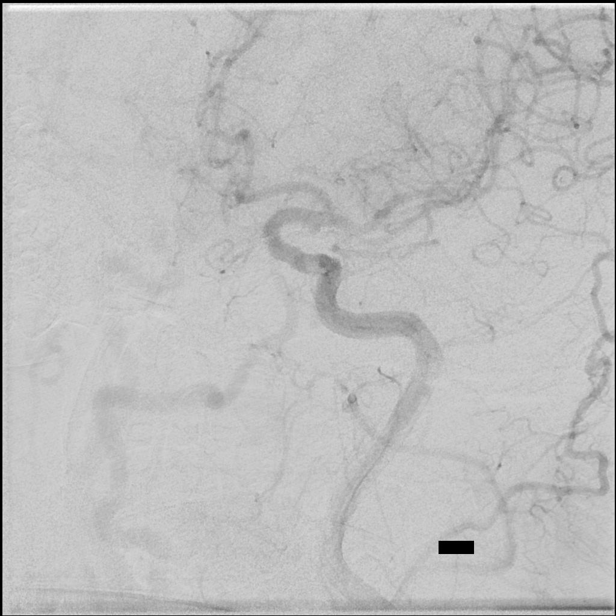

[12 of 24 positions shown; findings below may reference images not displayed]

Following a full explanation of the procedure along with the
potential associated complications, an informed witnessed consent
was obtained.

The right groin was prepped and draped in the usual sterile
fashion.  Thereafter using a modified Seldinger technique,
transfemoral access into the right common femoral artery was
obtained without difficulty.  Over a 0.035-inch guidewire, a 5-
French Pinnacle sheath was inserted.  Through this and also over a
0.035-inch guidewire, a 5-French JB1 catheter was advanced to the
aortic arch region and selectively positioned in the right common
carotid artery, the right subclavian artery, the left common
carotid artery and the left subclavian artery.

There were no acute complications.  The patient tolerated the
procedure well.

Medications:  None.

Contrast: Omnipaque 300 approximately 65 ml.
FINDINGS: The right common carotid arteriogram demonstrates the
right external carotid artery and its major branches to be normally
opacified.

The right internal carotid artery at the bulb has a wedge-shaped
atherosclerotic plaque resulting in approximately 10% stenosis by
the NASCET criteria.

Distal to this, the vessel is seen to opacify normally to the
cranial skull base.

The petrous segment is normal.

There is approximately 50% stenosis at the petrous-cavernous
junction with mild post-stenotic dilatation.

No surrounding intraluminal filling defects are seen.

Distal to this, the vessel is seen to opacify normally to the
distal cavernous and the supraclinoid segments.

The right middle and the right anterior cerebral arteries are seen
to opacify normally into the capillary and the venous phases.  The
right vertebral artery origin is normal.  The vessel is seen to
opacify normally to the cranial skull base.

There is normal opacification of the right posterior inferior
cerebellar artery and the right vertebrobasilar junction.

The opacified portions of the basilar artery, the posterior
cerebral arteries, the superior cerebellar arteries and the
anterior-inferior cerebellar arteries are grossly normal into the
delayed arterial phases.  Unopacified blood is seen in the basilar
artery from the contralateral vertebral artery.

The left subclavian arteriogram demonstrates a smooth calcific
atherosclerotic plaque along the medial superior aspect proximal to
the origin of the left vertebral artery.

The left vertebral artery origin is normal.

The vessel has mild tortuosity proximally.  Distal to this the
vessel is seen to opacify normally to the cranial skull base.

There is normal opacification of the left posterior-inferior
cerebellar artery and the left vertebrobasilar junction.

The opacified portion of the basilar artery, the posterior cerebral
arteries, the superior cerebellar arteries and the anterior-
inferior cerebellar arteries is normal into the capillary and the
venous phases.

Unopacified blood is seen in the basilar artery from the
contralateral vertebral artery.

The left common carotid bifurcation demonstrates calcified plaque
involving the anterior aspect of the distal left common carotid
artery.

The left external carotid artery and its major branches are normal.

The left internal carotid artery at the bulb has a smooth shallow
plaque along the posterolateral wall without associated stenosis or
ulceration.  The left internal carotid artery is otherwise seen to
opacify normally to the cranial skull base.

There is a smooth shallow partially calcified plaque along the
inferior aspect of the left petrous-cavernous junction.

The distal cavernous and the supraclinoid segments are normal.

A small infundibulum is seen at the origin of the left posterior
communicating artery.

The left middle and the left anterior cerebral arteries are seen to
opacify normally into the capillary and venous phases.
IMPRESSION: 1.  Approximately 50% stenosis of the right internal carotid artery
at the petrous- cavernous junction with mild post-stenotic
dilatation.  No associated filling defects are seen.
2.  Approximately 10% stenosis of the right internal carotid artery
at the bulb secondary to atherosclerotic disease.
3.  Small infundibulum at the origin of the left posterior
communicating artery.
4.  No definitive intracranial aneurysms noted.

The above findings were discussed with the patient and the
patient's daughter.  It was felt the patient should continue taking
325 mg of aspirin per day.  She has been instructed to stop her
Plavix.  A follow-up MRI MRA of the brain will be scheduled in
approximately 6 months from today.

## 2011-03-30 IMAGING — CT CT HEAD W/O CM
1 of 2 series · 13 of 30 positions shown, 17 images · non-contrast
Comparison: None.

CLINICAL DATA: History of right frontal ischemic stroke
approximately 2 weeks ago.  Patient on dual antiplatelet
medication. Evaluate for possible hemorrhage prior to anticipated
intervention.

CT HEAD WITHOUT CONTRAST
TECHNIQUE: Contiguous axial images were obtained from the base of
the skull through the vertex without contrast.

[Series 2: brain · axial · 0.47mm/px · z∈[+135,+259]mm · 13 of 28 slices shown, 17 images]
[im 2/28  brain]
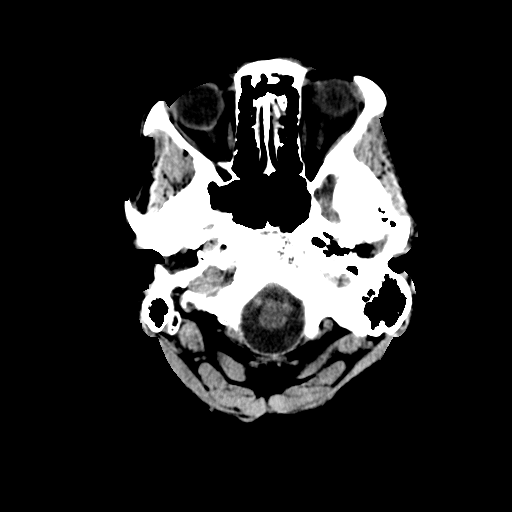
[im 2/28  bone]
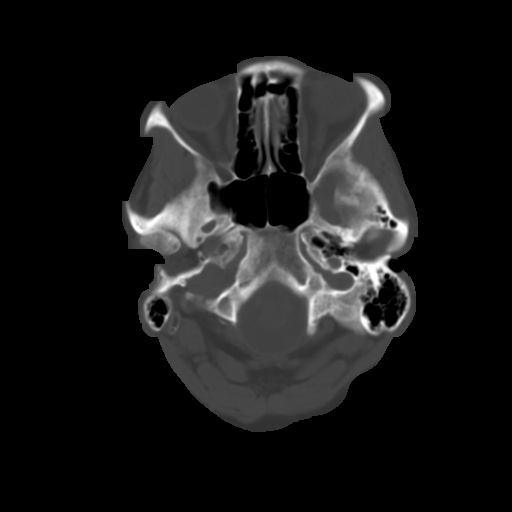
[im 4/28  brain]
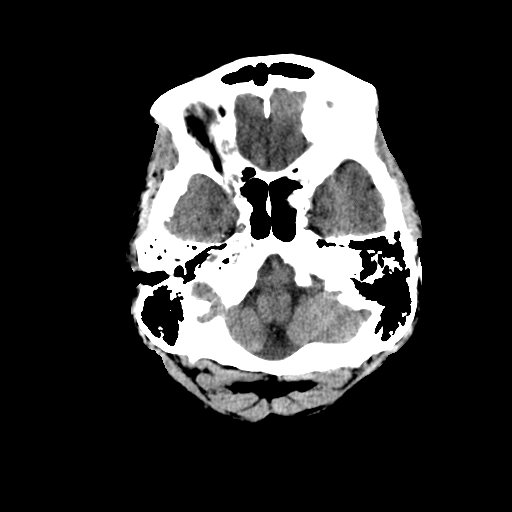
[im 6/28  brain]
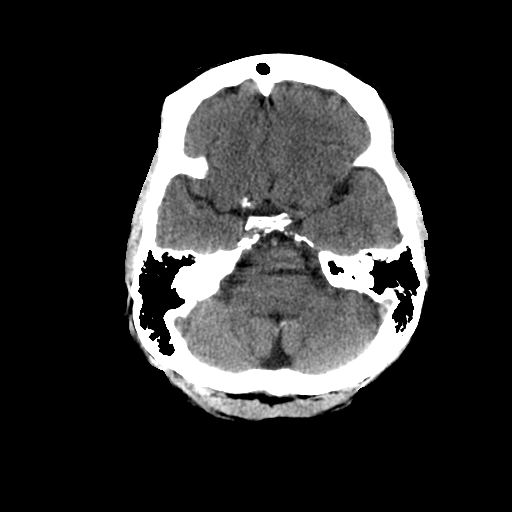
[im 8/28  brain]
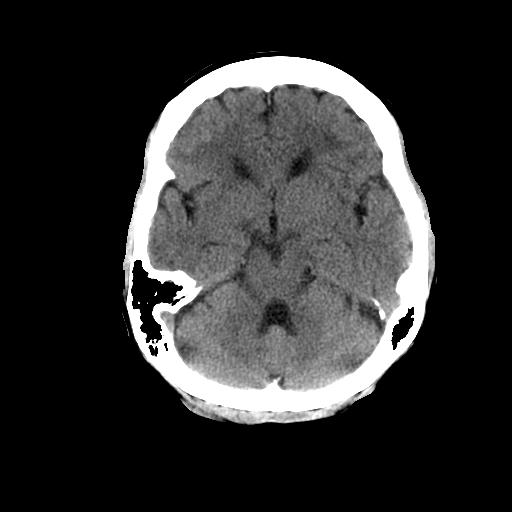
[im 10/28  brain]
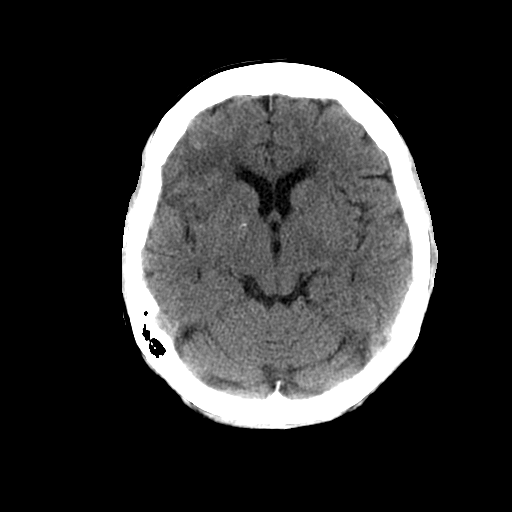
[im 10/28  bone]
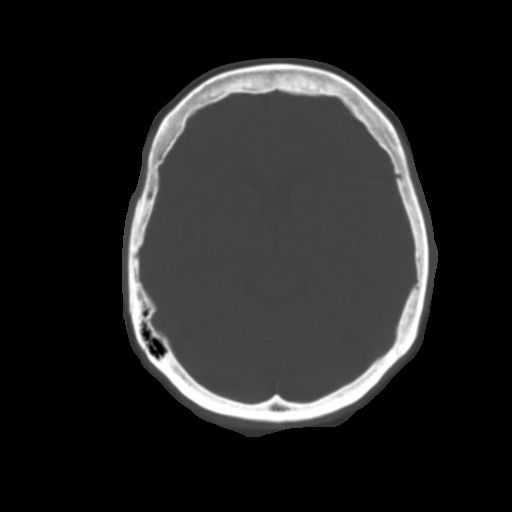
[im 12/28  brain]
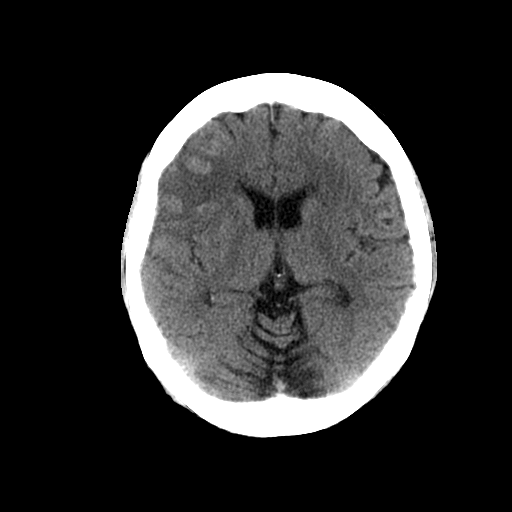
[im 14/28  brain]
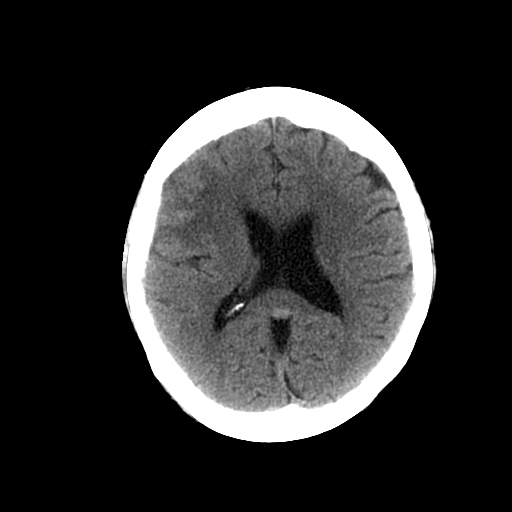
[im 16/28  brain]
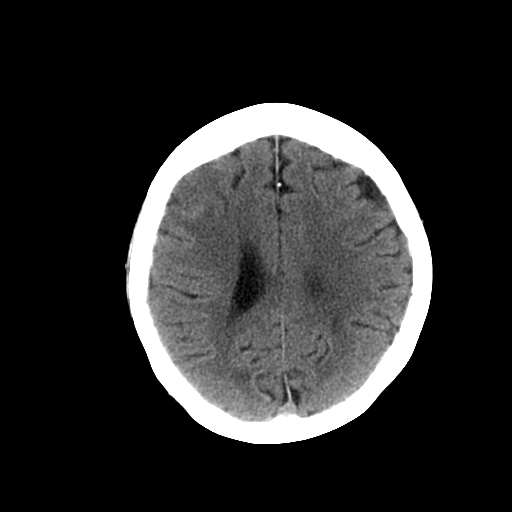
[im 18/28  brain]
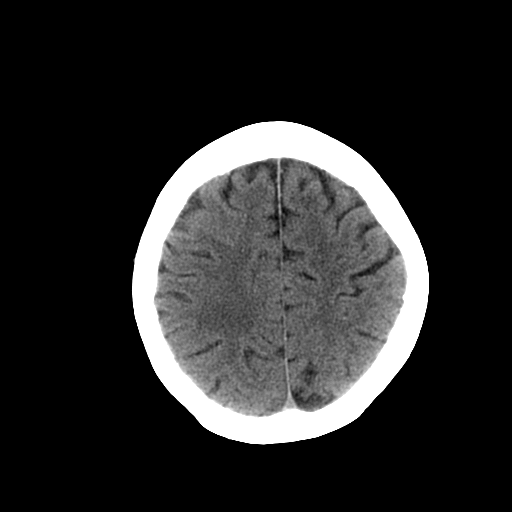
[im 18/28  bone]
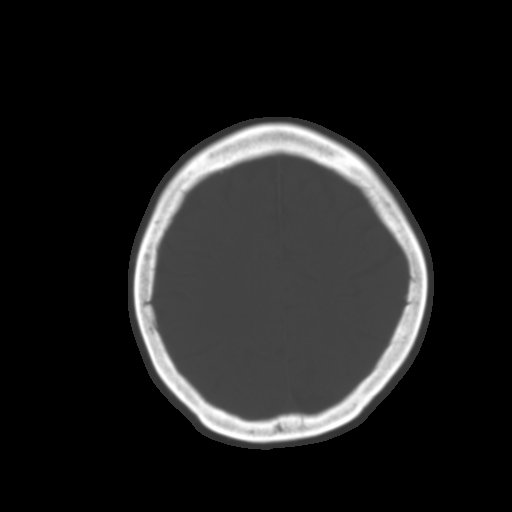
[im 20/28  brain]
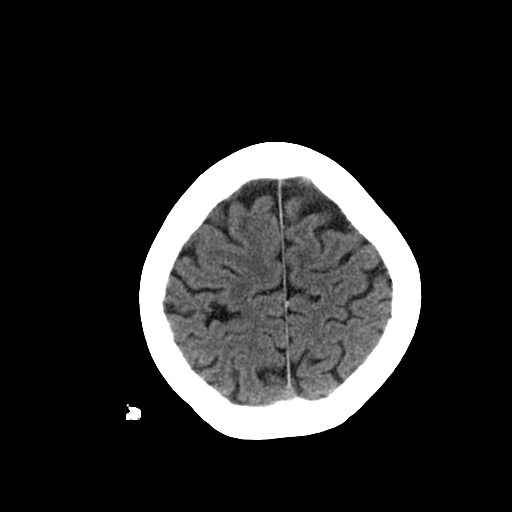
[im 22/28  brain]
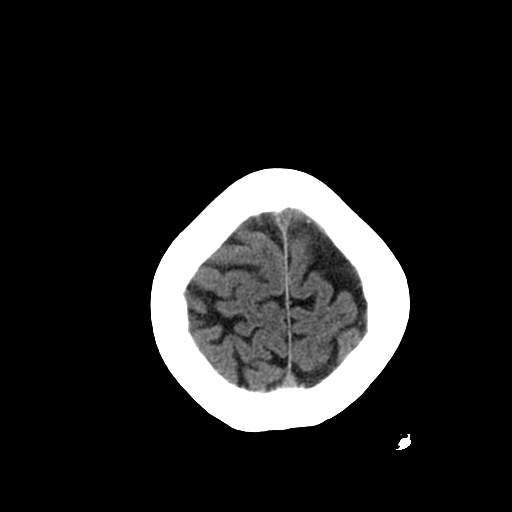
[im 24/28  brain]
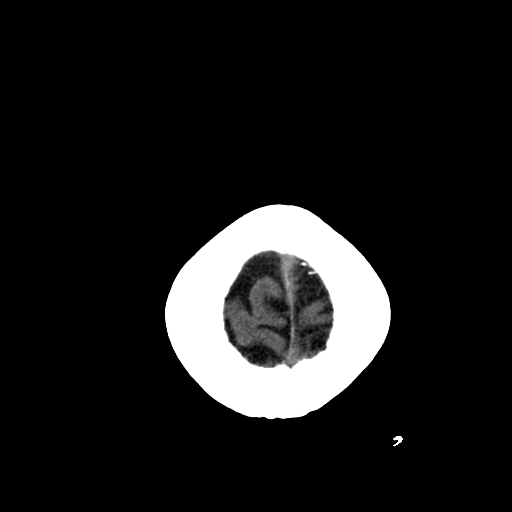
[im 26/28  brain]
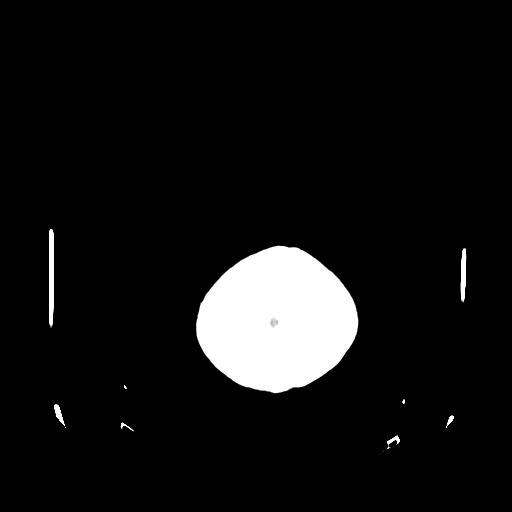
[im 26/28  bone]
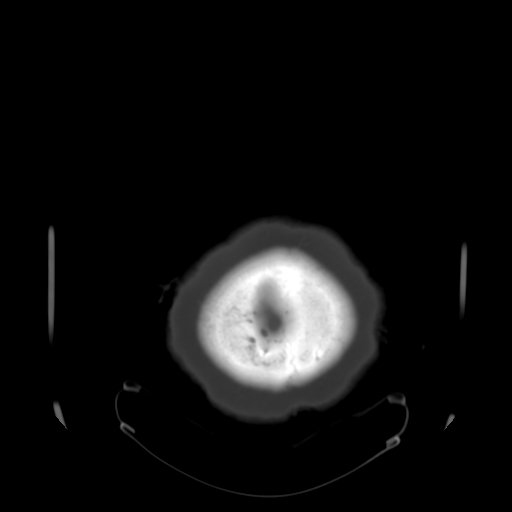

[13 of 30 positions shown; findings below may reference images not displayed]

MRI of the brain of 04/09/2010.  The gray-white
matter differentiation remains normal.

A moderate sized area of hypoattenuation is noted  in  the right
frontal cortical subcortical region associated with patchy cortical
hyperintensity.  Selective evaluation of the region of interest
demonstrates the Hounsfield units to be in the region of
approximately 35, suggesting possible micro hemorrhages.

The remainder of the brain demonstrates no significant changes.
Physiologic calcifications are noted in the globus paladi .

Prominence of the cerebellar folia especially in the region of the
vermis is suggestive of atrophy.

No acute osseous abnormalities noted. Findings:
IMPRESSION: 1.  Evolving ischemic infarct involving the right frontal
cortical/subcortical white matter, with suggestion of possible
micro hemorrhagic conversion.
2.  Mild cerebellar atrophy especially involving the vermis.

## 2011-06-03 ENCOUNTER — Inpatient Hospital Stay: Payer: Self-pay | Admitting: Specialist

## 2011-06-07 ENCOUNTER — Emergency Department: Payer: Self-pay | Admitting: Internal Medicine

## 2011-06-27 IMAGING — XA IR GI TUBE CONTRAST INJECTION
1 series · 13 of 17 positions shown · non-contrast
Comparison: None.

CLINICAL DATA: Verify gastrostomy tube position

GI TUBE INJECTION

[Series 1: run · 13 of 17 slices shown]
[im 1/17]
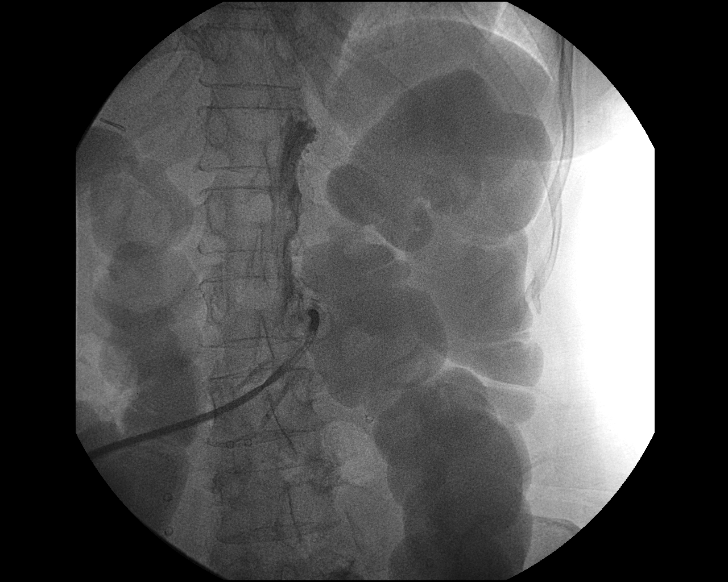
[im 2/17]
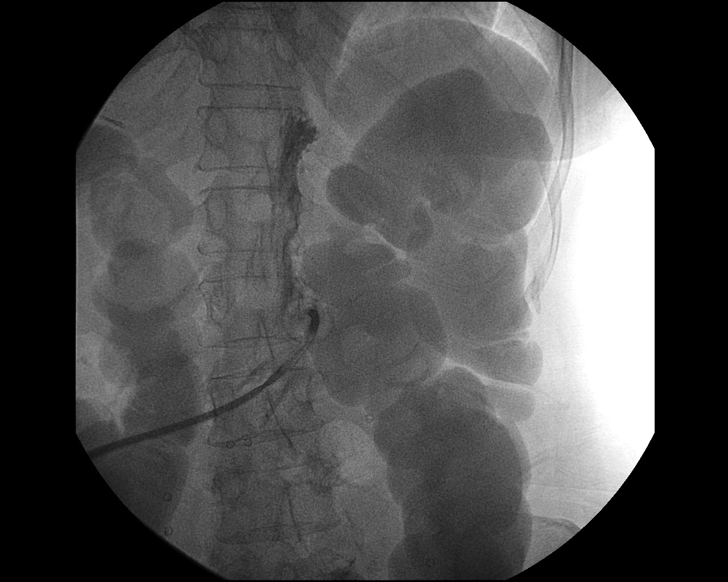
[im 4/17]
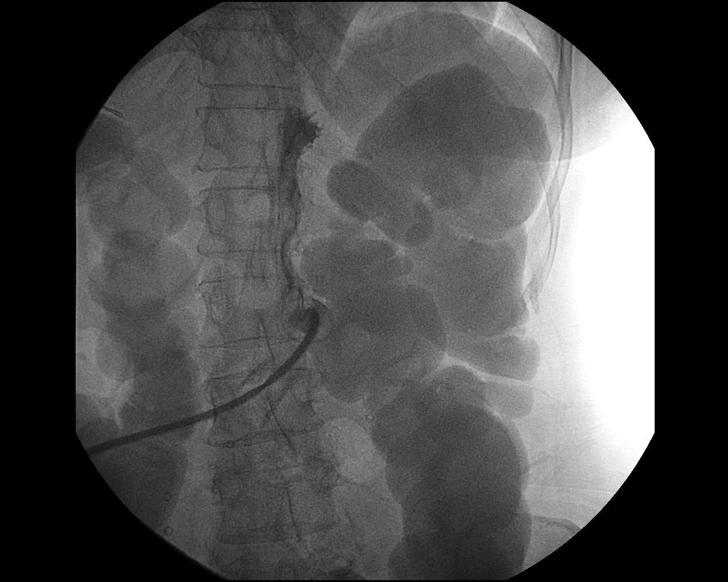
[im 5/17]
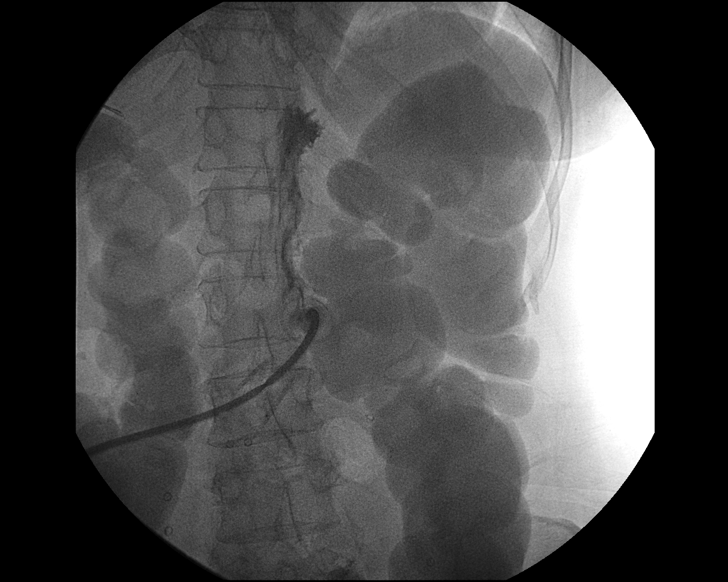
[im 6/17]
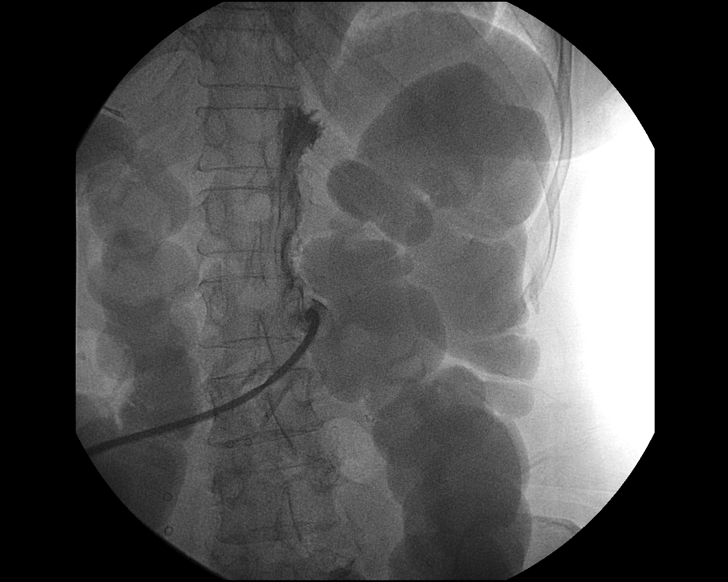
[im 8/17]
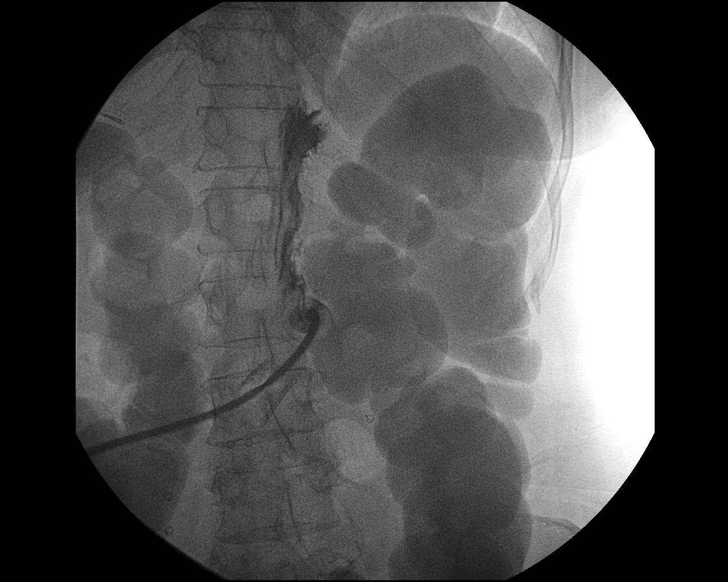
[im 9/17]
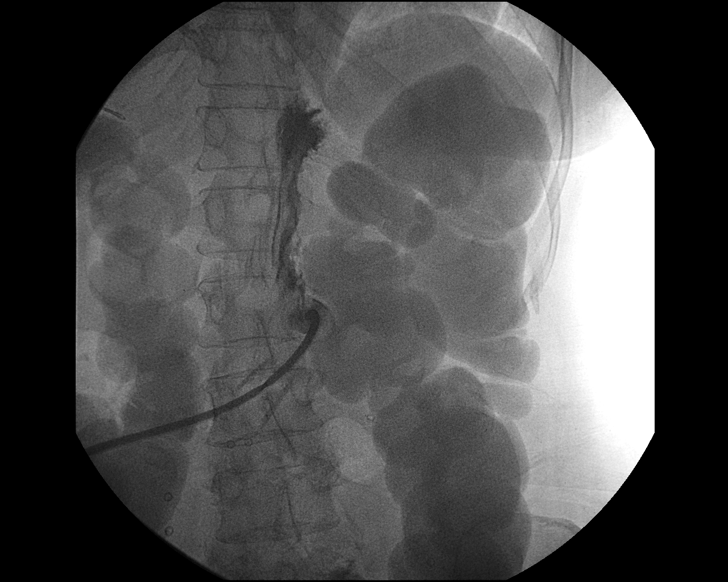
[im 10/17]
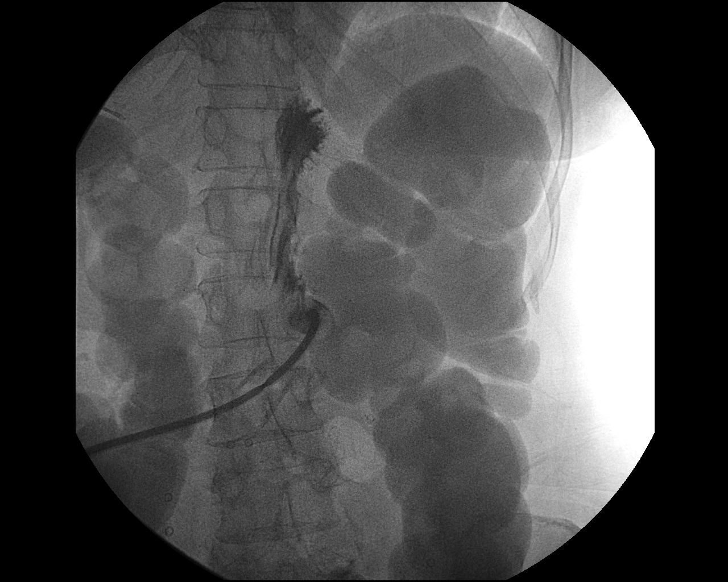
[im 12/17]
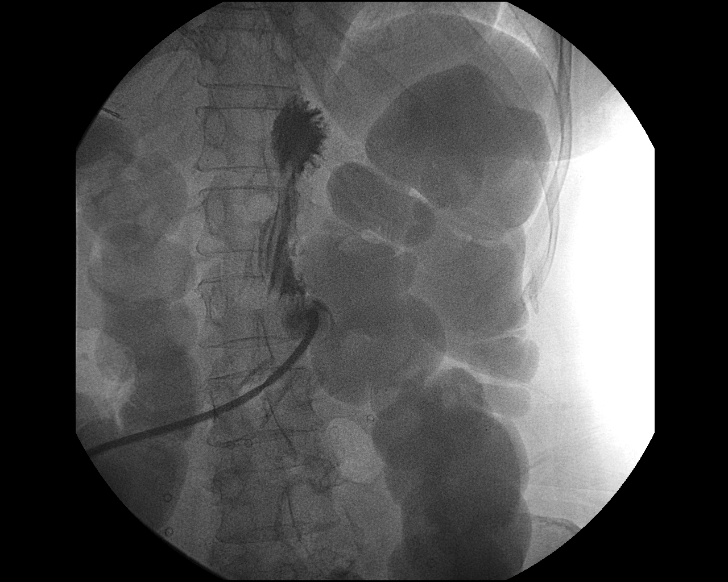
[im 13/17]
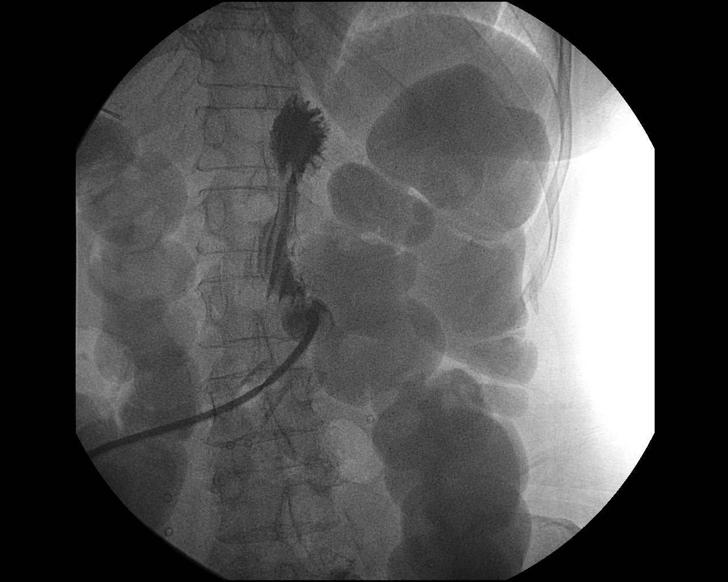
[im 14/17]
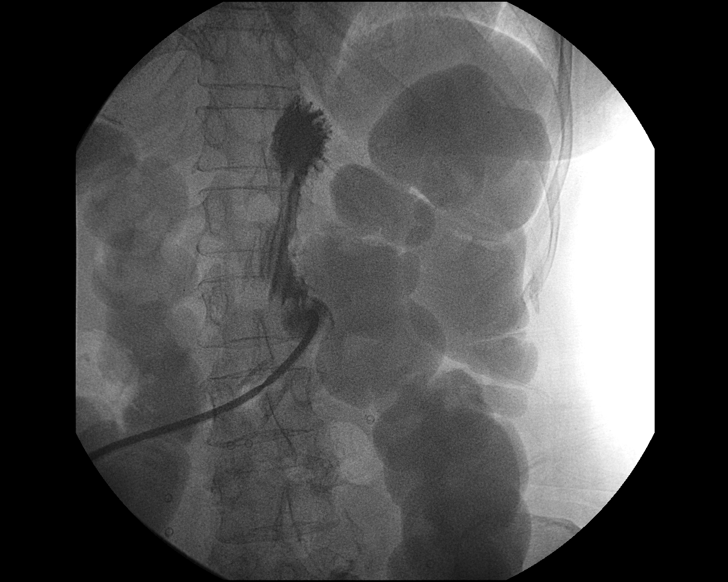
[im 16/17]
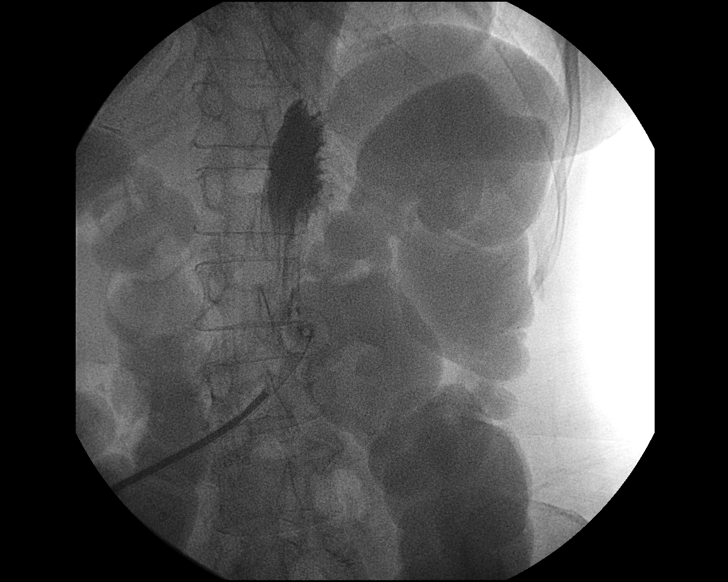
[im 17/17]
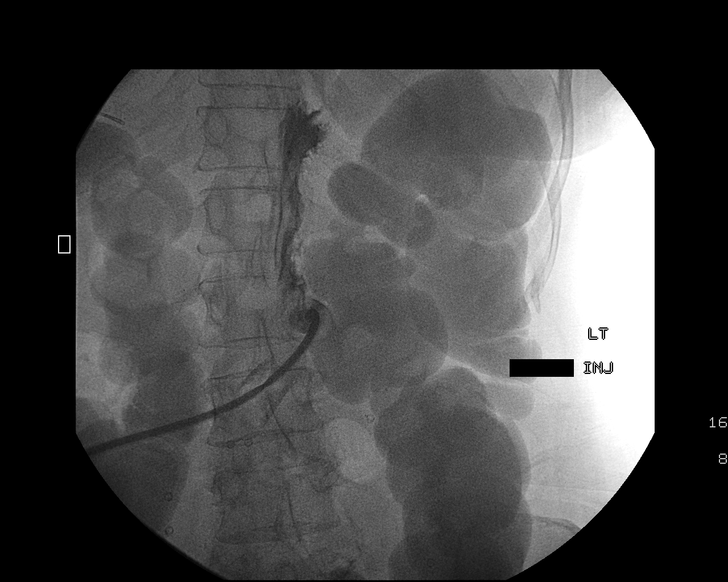

[13 of 17 positions shown; findings below may reference images not displayed]

FINDINGS: Contrast was injected into the gastrostomy tube.  The tip
of the tube is appropriately positioned in the body of the stomach
without extravasation or leak.
IMPRESSION: Gastrostomy tube is appropriately positioned and is without
complication.

## 2011-07-26 ENCOUNTER — Emergency Department: Payer: Self-pay | Admitting: Emergency Medicine

## 2011-12-03 ENCOUNTER — Emergency Department: Payer: Self-pay | Admitting: Emergency Medicine

## 2012-02-16 ENCOUNTER — Ambulatory Visit: Payer: Self-pay | Admitting: Family Medicine

## 2013-01-21 ENCOUNTER — Emergency Department: Payer: Self-pay | Admitting: Emergency Medicine

## 2013-01-21 LAB — COMPREHENSIVE METABOLIC PANEL
Albumin: 3.5 g/dL (ref 3.4–5.0)
Alkaline Phosphatase: 79 U/L (ref 50–136)
Anion Gap: 4 — ABNORMAL LOW (ref 7–16)
BUN: 35 mg/dL — ABNORMAL HIGH (ref 7–18)
Calcium, Total: 9.5 mg/dL (ref 8.5–10.1)
Chloride: 105 mmol/L (ref 98–107)
Co2: 29 mmol/L (ref 21–32)
Creatinine: 1.78 mg/dL — ABNORMAL HIGH (ref 0.60–1.30)
EGFR (African American): 32 — ABNORMAL LOW
EGFR (Non-African Amer.): 28 — ABNORMAL LOW
Potassium: 4.6 mmol/L (ref 3.5–5.1)
SGOT(AST): 34 U/L (ref 15–37)
SGPT (ALT): 23 U/L (ref 12–78)
Total Protein: 7.6 g/dL (ref 6.4–8.2)

## 2013-01-21 LAB — URINALYSIS, COMPLETE
Bacteria: NONE SEEN
Bilirubin,UR: NEGATIVE
Nitrite: NEGATIVE
Ph: 8 (ref 4.5–8.0)
WBC UR: 1 /HPF (ref 0–5)

## 2013-01-21 LAB — TROPONIN I: Troponin-I: 0.02 ng/mL

## 2013-01-21 LAB — CBC
MCHC: 31.5 g/dL — ABNORMAL LOW (ref 32.0–36.0)
MCV: 87 fL (ref 80–100)
Platelet: 133 10*3/uL — ABNORMAL LOW (ref 150–440)
RBC: 4.23 10*6/uL (ref 3.80–5.20)

## 2013-01-24 ENCOUNTER — Observation Stay: Payer: Self-pay | Admitting: Internal Medicine

## 2013-01-24 LAB — CBC
HCT: 32.2 % — ABNORMAL LOW (ref 35.0–47.0)
HGB: 10.3 g/dL — ABNORMAL LOW (ref 12.0–16.0)
MCH: 27.9 pg (ref 26.0–34.0)
MCV: 87 fL (ref 80–100)
RDW: 14.5 % (ref 11.5–14.5)
WBC: 4.5 10*3/uL (ref 3.6–11.0)

## 2013-01-24 LAB — COMPREHENSIVE METABOLIC PANEL
Albumin: 3.3 g/dL — ABNORMAL LOW (ref 3.4–5.0)
Alkaline Phosphatase: 69 U/L (ref 50–136)
BUN: 31 mg/dL — ABNORMAL HIGH (ref 7–18)
Bilirubin,Total: 0.4 mg/dL (ref 0.2–1.0)
Calcium, Total: 9.4 mg/dL (ref 8.5–10.1)
Chloride: 106 mmol/L (ref 98–107)
Co2: 27 mmol/L (ref 21–32)
Creatinine: 1.92 mg/dL — ABNORMAL HIGH (ref 0.60–1.30)
EGFR (Non-African Amer.): 25 — ABNORMAL LOW
Glucose: 132 mg/dL — ABNORMAL HIGH (ref 65–99)
Osmolality: 284 (ref 275–301)
Potassium: 4.4 mmol/L (ref 3.5–5.1)
Total Protein: 7.2 g/dL (ref 6.4–8.2)

## 2013-01-25 LAB — BASIC METABOLIC PANEL
Co2: 28 mmol/L (ref 21–32)
EGFR (African American): 38 — ABNORMAL LOW
EGFR (Non-African Amer.): 33 — ABNORMAL LOW
Osmolality: 286 (ref 275–301)

## 2013-01-25 LAB — CBC WITH DIFFERENTIAL/PLATELET
Eosinophil #: 0 10*3/uL (ref 0.0–0.7)
Eosinophil %: 0.8 %
HCT: 28.5 % — ABNORMAL LOW (ref 35.0–47.0)
Lymphocyte #: 1.2 10*3/uL (ref 1.0–3.6)
Lymphocyte %: 23.8 %
MCHC: 31.7 g/dL — ABNORMAL LOW (ref 32.0–36.0)
Monocyte %: 9.8 %
Neutrophil #: 3.2 10*3/uL (ref 1.4–6.5)
RBC: 3.28 10*6/uL — ABNORMAL LOW (ref 3.80–5.20)
RDW: 14.3 % (ref 11.5–14.5)
WBC: 4.9 10*3/uL (ref 3.6–11.0)

## 2013-01-25 LAB — HEMOGLOBIN A1C: Hemoglobin A1C: 5.3 % (ref 4.2–6.3)

## 2013-01-26 LAB — GLUCOSE, RANDOM: Glucose: 70 mg/dL (ref 65–99)

## 2013-01-26 LAB — CREATININE, SERUM: EGFR (African American): 48 — ABNORMAL LOW

## 2013-11-30 ENCOUNTER — Inpatient Hospital Stay: Payer: Self-pay | Admitting: Internal Medicine

## 2013-11-30 LAB — URINALYSIS, COMPLETE
Bilirubin,UR: NEGATIVE
Blood: NEGATIVE
GLUCOSE, UR: NEGATIVE mg/dL (ref 0–75)
KETONE: NEGATIVE
NITRITE: NEGATIVE
PH: 7 (ref 4.5–8.0)
Protein: 30
RBC,UR: 18 /HPF (ref 0–5)
Specific Gravity: 1.013 (ref 1.003–1.030)
Squamous Epithelial: NONE SEEN

## 2013-11-30 LAB — COMPREHENSIVE METABOLIC PANEL
ALBUMIN: 3.4 g/dL (ref 3.4–5.0)
ALK PHOS: 66 U/L
AST: 56 U/L — AB (ref 15–37)
Anion Gap: 5 — ABNORMAL LOW (ref 7–16)
BILIRUBIN TOTAL: 0.3 mg/dL (ref 0.2–1.0)
BUN: 46 mg/dL — ABNORMAL HIGH (ref 7–18)
CALCIUM: 9.6 mg/dL (ref 8.5–10.1)
CO2: 29 mmol/L (ref 21–32)
Chloride: 93 mmol/L — ABNORMAL LOW (ref 98–107)
Creatinine: 2.22 mg/dL — ABNORMAL HIGH (ref 0.60–1.30)
EGFR (African American): 25 — ABNORMAL LOW
GFR CALC NON AF AMER: 21 — AB
GLUCOSE: 169 mg/dL — AB (ref 65–99)
OSMOLALITY: 271 (ref 275–301)
Potassium: 5 mmol/L (ref 3.5–5.1)
SGPT (ALT): 50 U/L (ref 12–78)
Sodium: 127 mmol/L — ABNORMAL LOW (ref 136–145)
Total Protein: 8.5 g/dL — ABNORMAL HIGH (ref 6.4–8.2)

## 2013-11-30 LAB — CBC WITH DIFFERENTIAL/PLATELET
BASOS PCT: 0.5 %
Basophil #: 0 10*3/uL (ref 0.0–0.1)
Eosinophil #: 0 10*3/uL (ref 0.0–0.7)
Eosinophil %: 0 %
HCT: 32 % — ABNORMAL LOW (ref 35.0–47.0)
HGB: 10.7 g/dL — AB (ref 12.0–16.0)
LYMPHS ABS: 0.6 10*3/uL — AB (ref 1.0–3.6)
Lymphocyte %: 6.2 %
MCH: 28.5 pg (ref 26.0–34.0)
MCHC: 33.4 g/dL (ref 32.0–36.0)
MCV: 85 fL (ref 80–100)
MONO ABS: 1 x10 3/mm — AB (ref 0.2–0.9)
MONOS PCT: 10.5 %
NEUTROS ABS: 8.1 10*3/uL — AB (ref 1.4–6.5)
Neutrophil %: 82.8 %
Platelet: 112 10*3/uL — ABNORMAL LOW (ref 150–440)
RBC: 3.75 10*6/uL — ABNORMAL LOW (ref 3.80–5.20)
RDW: 14.1 % (ref 11.5–14.5)
WBC: 9.7 10*3/uL (ref 3.6–11.0)

## 2013-12-01 LAB — BASIC METABOLIC PANEL
Anion Gap: 2 — ABNORMAL LOW (ref 7–16)
BUN: 39 mg/dL — ABNORMAL HIGH (ref 7–18)
CHLORIDE: 105 mmol/L (ref 98–107)
Calcium, Total: 9.3 mg/dL (ref 8.5–10.1)
Co2: 27 mmol/L (ref 21–32)
Creatinine: 1.78 mg/dL — ABNORMAL HIGH (ref 0.60–1.30)
EGFR (African American): 32 — ABNORMAL LOW
EGFR (Non-African Amer.): 28 — ABNORMAL LOW
GLUCOSE: 89 mg/dL (ref 65–99)
OSMOLALITY: 277 (ref 275–301)
POTASSIUM: 4.4 mmol/L (ref 3.5–5.1)
Sodium: 134 mmol/L — ABNORMAL LOW (ref 136–145)

## 2013-12-01 LAB — URINE CULTURE

## 2013-12-03 LAB — BASIC METABOLIC PANEL
Anion Gap: 7 (ref 7–16)
BUN: 22 mg/dL — ABNORMAL HIGH (ref 7–18)
Calcium, Total: 8.9 mg/dL (ref 8.5–10.1)
Chloride: 107 mmol/L (ref 98–107)
Co2: 23 mmol/L (ref 21–32)
Creatinine: 1.15 mg/dL (ref 0.60–1.30)
EGFR (Non-African Amer.): 47 — ABNORMAL LOW
GFR CALC AF AMER: 55 — AB
Glucose: 151 mg/dL — ABNORMAL HIGH (ref 65–99)
Osmolality: 280 (ref 275–301)
POTASSIUM: 4 mmol/L (ref 3.5–5.1)
SODIUM: 137 mmol/L (ref 136–145)

## 2013-12-04 LAB — BASIC METABOLIC PANEL
Anion Gap: 8 (ref 7–16)
BUN: 26 mg/dL — ABNORMAL HIGH (ref 7–18)
Calcium, Total: 9.6 mg/dL (ref 8.5–10.1)
Chloride: 108 mmol/L — ABNORMAL HIGH (ref 98–107)
Co2: 25 mmol/L (ref 21–32)
Creatinine: 1.33 mg/dL — ABNORMAL HIGH (ref 0.60–1.30)
EGFR (African American): 46 — ABNORMAL LOW
EGFR (Non-African Amer.): 40 — ABNORMAL LOW
Glucose: 132 mg/dL — ABNORMAL HIGH (ref 65–99)
Osmolality: 288 (ref 275–301)
Potassium: 3.9 mmol/L (ref 3.5–5.1)
SODIUM: 141 mmol/L (ref 136–145)

## 2013-12-04 LAB — CBC WITH DIFFERENTIAL/PLATELET
BASOS PCT: 0.1 %
Basophil #: 0 10*3/uL (ref 0.0–0.1)
EOS PCT: 0 %
Eosinophil #: 0 10*3/uL (ref 0.0–0.7)
HCT: 30.6 % — ABNORMAL LOW (ref 35.0–47.0)
HGB: 9.7 g/dL — ABNORMAL LOW (ref 12.0–16.0)
LYMPHS ABS: 0.8 10*3/uL — AB (ref 1.0–3.6)
Lymphocyte %: 6.9 %
MCH: 26.7 pg (ref 26.0–34.0)
MCHC: 31.7 g/dL — AB (ref 32.0–36.0)
MCV: 84 fL (ref 80–100)
MONOS PCT: 9.6 %
Monocyte #: 1.1 x10 3/mm — ABNORMAL HIGH (ref 0.2–0.9)
NEUTROS PCT: 83.4 %
Neutrophil #: 9.7 10*3/uL — ABNORMAL HIGH (ref 1.4–6.5)
Platelet: 197 10*3/uL (ref 150–440)
RBC: 3.64 10*6/uL — AB (ref 3.80–5.20)
RDW: 14.1 % (ref 11.5–14.5)
WBC: 11.6 10*3/uL — AB (ref 3.6–11.0)

## 2013-12-05 LAB — CULTURE, BLOOD (SINGLE)

## 2013-12-06 ENCOUNTER — Ambulatory Visit: Payer: Self-pay | Admitting: Hospice and Palliative Medicine

## 2013-12-06 LAB — CBC WITH DIFFERENTIAL/PLATELET
Basophil #: 0 10*3/uL (ref 0.0–0.1)
Basophil %: 0.1 %
EOS ABS: 0 10*3/uL (ref 0.0–0.7)
Eosinophil %: 0 %
HCT: 27.6 % — AB (ref 35.0–47.0)
HGB: 9.1 g/dL — ABNORMAL LOW (ref 12.0–16.0)
Lymphocyte #: 0.6 10*3/uL — ABNORMAL LOW (ref 1.0–3.6)
Lymphocyte %: 6.9 %
MCH: 27.8 pg (ref 26.0–34.0)
MCHC: 32.9 g/dL (ref 32.0–36.0)
MCV: 85 fL (ref 80–100)
MONOS PCT: 2.6 %
Monocyte #: 0.2 x10 3/mm (ref 0.2–0.9)
NEUTROS PCT: 90.4 %
Neutrophil #: 7.3 10*3/uL — ABNORMAL HIGH (ref 1.4–6.5)
PLATELETS: 228 10*3/uL (ref 150–440)
RBC: 3.27 10*6/uL — AB (ref 3.80–5.20)
RDW: 14.4 % (ref 11.5–14.5)
WBC: 8.1 10*3/uL (ref 3.6–11.0)

## 2013-12-06 LAB — BASIC METABOLIC PANEL
ANION GAP: 7 (ref 7–16)
BUN: 54 mg/dL — AB (ref 7–18)
CALCIUM: 10.2 mg/dL — AB (ref 8.5–10.1)
CHLORIDE: 119 mmol/L — AB (ref 98–107)
Co2: 23 mmol/L (ref 21–32)
Creatinine: 1.4 mg/dL — ABNORMAL HIGH (ref 0.60–1.30)
EGFR (African American): 43 — ABNORMAL LOW
EGFR (Non-African Amer.): 37 — ABNORMAL LOW
Glucose: 159 mg/dL — ABNORMAL HIGH (ref 65–99)
Osmolality: 314 (ref 275–301)
Potassium: 4.1 mmol/L (ref 3.5–5.1)
Sodium: 149 mmol/L — ABNORMAL HIGH (ref 136–145)

## 2013-12-07 LAB — BASIC METABOLIC PANEL
Anion Gap: 8 (ref 7–16)
BUN: 65 mg/dL — ABNORMAL HIGH (ref 7–18)
CHLORIDE: 120 mmol/L — AB (ref 98–107)
CO2: 24 mmol/L (ref 21–32)
CREATININE: 1.43 mg/dL — AB (ref 0.60–1.30)
Calcium, Total: 9.6 mg/dL (ref 8.5–10.1)
EGFR (African American): 42 — ABNORMAL LOW
EGFR (Non-African Amer.): 36 — ABNORMAL LOW
GLUCOSE: 193 mg/dL — AB (ref 65–99)
OSMOLALITY: 326 (ref 275–301)
Potassium: 4.4 mmol/L (ref 3.5–5.1)
SODIUM: 152 mmol/L — AB (ref 136–145)

## 2013-12-07 LAB — SODIUM: SODIUM: 149 mmol/L — AB (ref 136–145)

## 2013-12-08 LAB — BASIC METABOLIC PANEL
ANION GAP: 6 — AB (ref 7–16)
BUN: 59 mg/dL — AB (ref 7–18)
Calcium, Total: 9.3 mg/dL (ref 8.5–10.1)
Chloride: 118 mmol/L — ABNORMAL HIGH (ref 98–107)
Co2: 24 mmol/L (ref 21–32)
Creatinine: 1.3 mg/dL (ref 0.60–1.30)
EGFR (African American): 47 — ABNORMAL LOW
GFR CALC NON AF AMER: 41 — AB
GLUCOSE: 262 mg/dL — AB (ref 65–99)
Osmolality: 320 (ref 275–301)
POTASSIUM: 4.3 mmol/L (ref 3.5–5.1)
Sodium: 148 mmol/L — ABNORMAL HIGH (ref 136–145)

## 2013-12-09 LAB — MAGNESIUM: Magnesium: 2.3 mg/dL

## 2013-12-09 LAB — PHOSPHORUS: PHOSPHORUS: 3.1 mg/dL (ref 2.5–4.9)

## 2013-12-10 LAB — BASIC METABOLIC PANEL
Anion Gap: 5 — ABNORMAL LOW (ref 7–16)
BUN: 47 mg/dL — ABNORMAL HIGH (ref 7–18)
CREATININE: 1.19 mg/dL (ref 0.60–1.30)
Calcium, Total: 9.2 mg/dL (ref 8.5–10.1)
Chloride: 108 mmol/L — ABNORMAL HIGH (ref 98–107)
Co2: 28 mmol/L (ref 21–32)
GFR CALC AF AMER: 52 — AB
GFR CALC NON AF AMER: 45 — AB
Glucose: 369 mg/dL — ABNORMAL HIGH (ref 65–99)
Osmolality: 309 (ref 275–301)
POTASSIUM: 4.2 mmol/L (ref 3.5–5.1)
SODIUM: 141 mmol/L (ref 136–145)

## 2013-12-16 ENCOUNTER — Ambulatory Visit: Payer: Self-pay | Admitting: Hospice and Palliative Medicine

## 2014-04-30 ENCOUNTER — Ambulatory Visit: Payer: Self-pay | Admitting: Ophthalmology

## 2014-06-28 ENCOUNTER — Encounter: Payer: Self-pay | Admitting: *Deleted

## 2014-07-04 ENCOUNTER — Inpatient Hospital Stay: Payer: Self-pay | Admitting: Surgery

## 2014-07-04 LAB — PROTIME-INR
INR: 1
Prothrombin Time: 12.6 secs (ref 11.5–14.7)

## 2014-07-04 LAB — COMPREHENSIVE METABOLIC PANEL
Albumin: 3.3 g/dL — ABNORMAL LOW (ref 3.4–5.0)
Alkaline Phosphatase: 90 U/L
Anion Gap: 7 (ref 7–16)
BILIRUBIN TOTAL: 0.2 mg/dL (ref 0.2–1.0)
BUN: 75 mg/dL — AB (ref 7–18)
CALCIUM: 9.9 mg/dL (ref 8.5–10.1)
CO2: 24 mmol/L (ref 21–32)
CREATININE: 1.69 mg/dL — AB (ref 0.60–1.30)
Chloride: 104 mmol/L (ref 98–107)
EGFR (African American): 34 — ABNORMAL LOW
GFR CALC NON AF AMER: 29 — AB
GLUCOSE: 143 mg/dL — AB (ref 65–99)
Osmolality: 295 (ref 275–301)
Potassium: 4 mmol/L (ref 3.5–5.1)
SGOT(AST): 97 U/L — ABNORMAL HIGH (ref 15–37)
SGPT (ALT): 93 U/L — ABNORMAL HIGH
Sodium: 135 mmol/L — ABNORMAL LOW (ref 136–145)
Total Protein: 7.4 g/dL (ref 6.4–8.2)

## 2014-07-04 LAB — URINALYSIS, COMPLETE
BACTERIA: NONE SEEN
BLOOD: NEGATIVE
Bilirubin,UR: NEGATIVE
GLUCOSE, UR: NEGATIVE mg/dL (ref 0–75)
Ketone: NEGATIVE
LEUKOCYTE ESTERASE: NEGATIVE
NITRITE: NEGATIVE
PH: 7 (ref 4.5–8.0)
PROTEIN: NEGATIVE
SPECIFIC GRAVITY: 1.018 (ref 1.003–1.030)
Squamous Epithelial: <1
WBC UR: <1 /HPF (ref 0–5)

## 2014-07-04 LAB — CBC
HCT: 32 % — ABNORMAL LOW (ref 35.0–47.0)
HGB: 9.7 g/dL — ABNORMAL LOW (ref 12.0–16.0)
MCH: 26.4 pg (ref 26.0–34.0)
MCHC: 30.3 g/dL — ABNORMAL LOW (ref 32.0–36.0)
MCV: 87 fL (ref 80–100)
PLATELETS: 137 10*3/uL — AB (ref 150–440)
RBC: 3.66 10*6/uL — AB (ref 3.80–5.20)
RDW: 14.7 % — ABNORMAL HIGH (ref 11.5–14.5)
WBC: 10.8 10*3/uL (ref 3.6–11.0)

## 2014-07-04 LAB — LIPASE, BLOOD: LIPASE: 600 U/L — AB (ref 73–393)

## 2014-07-04 LAB — CK TOTAL AND CKMB (NOT AT ARMC)
CK, Total: 152 U/L
CK-MB: 2.5 ng/mL (ref 0.5–3.6)

## 2014-07-04 LAB — TROPONIN I

## 2014-07-05 LAB — BASIC METABOLIC PANEL
Anion Gap: 12 (ref 7–16)
BUN: 58 mg/dL — AB (ref 7–18)
CHLORIDE: 104 mmol/L (ref 98–107)
CO2: 23 mmol/L (ref 21–32)
Calcium, Total: 9.2 mg/dL (ref 8.5–10.1)
Creatinine: 1.31 mg/dL — ABNORMAL HIGH (ref 0.60–1.30)
EGFR (African American): 46 — ABNORMAL LOW
EGFR (Non-African Amer.): 40 — ABNORMAL LOW
GLUCOSE: 163 mg/dL — AB (ref 65–99)
OSMOLALITY: 297 (ref 275–301)
POTASSIUM: 4.7 mmol/L (ref 3.5–5.1)
SODIUM: 139 mmol/L (ref 136–145)

## 2014-07-05 LAB — CBC WITH DIFFERENTIAL/PLATELET
Basophil #: 0 10*3/uL (ref 0.0–0.1)
Basophil %: 0.2 %
Eosinophil #: 0 10*3/uL (ref 0.0–0.7)
Eosinophil %: 0 %
HCT: 32.5 % — AB (ref 35.0–47.0)
HGB: 10.2 g/dL — ABNORMAL LOW (ref 12.0–16.0)
Lymphocyte #: 0.3 10*3/uL — ABNORMAL LOW (ref 1.0–3.6)
Lymphocyte %: 2.4 %
MCH: 27.2 pg (ref 26.0–34.0)
MCHC: 31.5 g/dL — ABNORMAL LOW (ref 32.0–36.0)
MCV: 86 fL (ref 80–100)
MONO ABS: 0.6 x10 3/mm (ref 0.2–0.9)
Monocyte %: 4.5 %
NEUTROS ABS: 13.2 10*3/uL — AB (ref 1.4–6.5)
NEUTROS PCT: 92.9 %
Platelet: 127 10*3/uL — ABNORMAL LOW (ref 150–440)
RBC: 3.76 10*6/uL — AB (ref 3.80–5.20)
RDW: 15 % — AB (ref 11.5–14.5)
WBC: 14.2 10*3/uL — ABNORMAL HIGH (ref 3.6–11.0)

## 2014-07-06 LAB — BASIC METABOLIC PANEL
ANION GAP: 11 (ref 7–16)
BUN: 79 mg/dL — ABNORMAL HIGH (ref 7–18)
CALCIUM: 9.2 mg/dL (ref 8.5–10.1)
CREATININE: 1.79 mg/dL — AB (ref 0.60–1.30)
Chloride: 107 mmol/L (ref 98–107)
Co2: 23 mmol/L (ref 21–32)
GFR CALC AF AMER: 32 — AB
GFR CALC NON AF AMER: 27 — AB
Glucose: 99 mg/dL (ref 65–99)
OSMOLALITY: 305 (ref 275–301)
Potassium: 4.8 mmol/L (ref 3.5–5.1)
Sodium: 141 mmol/L (ref 136–145)

## 2014-07-06 LAB — PHOSPHORUS: Phosphorus: 3.5 mg/dL (ref 2.5–4.9)

## 2014-07-06 LAB — CBC WITH DIFFERENTIAL/PLATELET
BASOS PCT: 0.2 %
Basophil #: 0 10*3/uL (ref 0.0–0.1)
EOS ABS: 0 10*3/uL (ref 0.0–0.7)
Eosinophil %: 0 %
HCT: 31.1 % — AB (ref 35.0–47.0)
HGB: 10 g/dL — ABNORMAL LOW (ref 12.0–16.0)
LYMPHS ABS: 0.4 10*3/uL — AB (ref 1.0–3.6)
Lymphocyte %: 4.8 %
MCH: 27.5 pg (ref 26.0–34.0)
MCHC: 32.1 g/dL (ref 32.0–36.0)
MCV: 86 fL (ref 80–100)
Monocyte #: 0.6 x10 3/mm (ref 0.2–0.9)
Monocyte %: 6.4 %
Neutrophil #: 8.2 10*3/uL — ABNORMAL HIGH (ref 1.4–6.5)
Neutrophil %: 88.6 %
PLATELETS: 106 10*3/uL — AB (ref 150–440)
RBC: 3.62 10*6/uL — ABNORMAL LOW (ref 3.80–5.20)
RDW: 14.9 % — ABNORMAL HIGH (ref 11.5–14.5)
WBC: 9.3 10*3/uL (ref 3.6–11.0)

## 2014-07-06 LAB — MAGNESIUM: MAGNESIUM: 1.9 mg/dL

## 2014-07-07 LAB — CBC WITH DIFFERENTIAL/PLATELET
BASOS PCT: 0.1 %
Basophil #: 0 10*3/uL (ref 0.0–0.1)
EOS ABS: 0 10*3/uL (ref 0.0–0.7)
Eosinophil %: 0.3 %
HCT: 27.6 % — AB (ref 35.0–47.0)
HGB: 8.4 g/dL — ABNORMAL LOW (ref 12.0–16.0)
Lymphocyte #: 0.3 10*3/uL — ABNORMAL LOW (ref 1.0–3.6)
Lymphocyte %: 3 %
MCH: 26.6 pg (ref 26.0–34.0)
MCHC: 30.5 g/dL — ABNORMAL LOW (ref 32.0–36.0)
MCV: 87 fL (ref 80–100)
MONOS PCT: 4.1 %
Monocyte #: 0.4 x10 3/mm (ref 0.2–0.9)
NEUTROS ABS: 10.1 10*3/uL — AB (ref 1.4–6.5)
NEUTROS PCT: 92.5 %
PLATELETS: 97 10*3/uL — AB (ref 150–440)
RBC: 3.17 10*6/uL — ABNORMAL LOW (ref 3.80–5.20)
RDW: 14.8 % — ABNORMAL HIGH (ref 11.5–14.5)
WBC: 10.9 10*3/uL (ref 3.6–11.0)

## 2014-07-07 LAB — BASIC METABOLIC PANEL
Anion Gap: 12 (ref 7–16)
BUN: 72 mg/dL — ABNORMAL HIGH (ref 7–18)
Calcium, Total: 8.2 mg/dL — ABNORMAL LOW (ref 8.5–10.1)
Chloride: 109 mmol/L — ABNORMAL HIGH (ref 98–107)
Co2: 23 mmol/L (ref 21–32)
Creatinine: 1.7 mg/dL — ABNORMAL HIGH (ref 0.60–1.30)
EGFR (African American): 34 — ABNORMAL LOW
EGFR (Non-African Amer.): 29 — ABNORMAL LOW
Glucose: 92 mg/dL (ref 65–99)
Osmolality: 308 (ref 275–301)
Potassium: 4.2 mmol/L (ref 3.5–5.1)
Sodium: 144 mmol/L (ref 136–145)

## 2014-07-08 LAB — BASIC METABOLIC PANEL
ANION GAP: 9 (ref 7–16)
BUN: 50 mg/dL — ABNORMAL HIGH (ref 7–18)
CHLORIDE: 116 mmol/L — AB (ref 98–107)
CO2: 23 mmol/L (ref 21–32)
CREATININE: 1.1 mg/dL (ref 0.60–1.30)
Calcium, Total: 8 mg/dL — ABNORMAL LOW (ref 8.5–10.1)
GFR CALC AF AMER: 57 — AB
GFR CALC NON AF AMER: 49 — AB
Glucose: 87 mg/dL (ref 65–99)
OSMOLALITY: 307 (ref 275–301)
POTASSIUM: 3.7 mmol/L (ref 3.5–5.1)
Sodium: 148 mmol/L — ABNORMAL HIGH (ref 136–145)

## 2014-07-09 LAB — BASIC METABOLIC PANEL
ANION GAP: 10 (ref 7–16)
BUN: 46 mg/dL — ABNORMAL HIGH (ref 7–18)
CALCIUM: 8.4 mg/dL — AB (ref 8.5–10.1)
Chloride: 115 mmol/L — ABNORMAL HIGH (ref 98–107)
Co2: 23 mmol/L (ref 21–32)
Creatinine: 1.07 mg/dL (ref 0.60–1.30)
EGFR (African American): 59 — ABNORMAL LOW
GFR CALC NON AF AMER: 51 — AB
GLUCOSE: 122 mg/dL — AB (ref 65–99)
Osmolality: 307 (ref 275–301)
POTASSIUM: 3.2 mmol/L — AB (ref 3.5–5.1)
Sodium: 148 mmol/L — ABNORMAL HIGH (ref 136–145)

## 2014-07-09 LAB — PATHOLOGY REPORT

## 2014-07-10 LAB — BASIC METABOLIC PANEL
ANION GAP: 5 — AB (ref 7–16)
BUN: 45 mg/dL — AB (ref 7–18)
CO2: 29 mmol/L (ref 21–32)
CREATININE: 1.24 mg/dL (ref 0.60–1.30)
Calcium, Total: 8 mg/dL — ABNORMAL LOW (ref 8.5–10.1)
Chloride: 117 mmol/L — ABNORMAL HIGH (ref 98–107)
GFR CALC AF AMER: 50 — AB
GFR CALC NON AF AMER: 43 — AB
GLUCOSE: 176 mg/dL — AB (ref 65–99)
Osmolality: 316 (ref 275–301)
POTASSIUM: 3 mmol/L — AB (ref 3.5–5.1)
SODIUM: 151 mmol/L — AB (ref 136–145)

## 2014-07-10 LAB — MAGNESIUM: Magnesium: 1.4 mg/dL — ABNORMAL LOW

## 2014-07-10 LAB — PHOSPHORUS: Phosphorus: 2.1 mg/dL — ABNORMAL LOW (ref 2.5–4.9)

## 2014-07-11 LAB — BASIC METABOLIC PANEL
ANION GAP: 5 — AB (ref 7–16)
BUN: 42 mg/dL — ABNORMAL HIGH (ref 7–18)
Calcium, Total: 8.1 mg/dL — ABNORMAL LOW (ref 8.5–10.1)
Chloride: 116 mmol/L — ABNORMAL HIGH (ref 98–107)
Co2: 31 mmol/L (ref 21–32)
Creatinine: 1.16 mg/dL (ref 0.60–1.30)
EGFR (African American): 59 — ABNORMAL LOW
EGFR (Non-African Amer.): 49 — ABNORMAL LOW
GLUCOSE: 159 mg/dL — AB (ref 65–99)
OSMOLALITY: 316 (ref 275–301)
POTASSIUM: 3.5 mmol/L (ref 3.5–5.1)
Sodium: 152 mmol/L — ABNORMAL HIGH (ref 136–145)

## 2014-07-11 LAB — PLATELET COUNT: Platelet: 173 10*3/uL (ref 150–440)

## 2014-07-11 LAB — MAGNESIUM: Magnesium: 1.9 mg/dL

## 2014-07-12 LAB — BASIC METABOLIC PANEL
ANION GAP: 3 — AB (ref 7–16)
BUN: 43 mg/dL — AB (ref 7–18)
CALCIUM: 7.7 mg/dL — AB (ref 8.5–10.1)
CREATININE: 1.14 mg/dL (ref 0.60–1.30)
Chloride: 112 mmol/L — ABNORMAL HIGH (ref 98–107)
Co2: 31 mmol/L (ref 21–32)
EGFR (African American): >60
EGFR (Non-African Amer.): 50 — ABNORMAL LOW
Glucose: 218 mg/dL — ABNORMAL HIGH (ref 65–99)
Osmolality: 308 (ref 275–301)
Potassium: 3.3 mmol/L — ABNORMAL LOW (ref 3.5–5.1)
Sodium: 146 mmol/L — ABNORMAL HIGH (ref 136–145)

## 2014-07-13 LAB — CBC WITH DIFFERENTIAL/PLATELET
BASOS ABS: 0.1 10*3/uL (ref 0.0–0.1)
Basophil %: 0.4 %
Eosinophil #: 0.2 10*3/uL (ref 0.0–0.7)
Eosinophil %: 0.9 %
HCT: 24.8 % — ABNORMAL LOW (ref 35.0–47.0)
HGB: 7.5 g/dL — AB (ref 12.0–16.0)
Lymphocyte #: 1.3 10*3/uL (ref 1.0–3.6)
Lymphocyte %: 7 %
MCH: 25.7 pg — ABNORMAL LOW (ref 26.0–34.0)
MCHC: 30.1 g/dL — ABNORMAL LOW (ref 32.0–36.0)
MCV: 85 fL (ref 80–100)
Monocyte #: 1.1 x10 3/mm — ABNORMAL HIGH (ref 0.2–0.9)
Monocyte %: 5.7 %
NEUTROS PCT: 86 %
Neutrophil #: 16.2 10*3/uL — ABNORMAL HIGH (ref 1.4–6.5)
Platelet: 218 10*3/uL (ref 150–440)
RBC: 2.9 10*6/uL — AB (ref 3.80–5.20)
RDW: 15.3 % — ABNORMAL HIGH (ref 11.5–14.5)
WBC: 18.8 10*3/uL — ABNORMAL HIGH (ref 3.6–11.0)

## 2014-07-13 LAB — BASIC METABOLIC PANEL
Anion Gap: 2 — ABNORMAL LOW (ref 7–16)
BUN: 35 mg/dL — ABNORMAL HIGH (ref 7–18)
Calcium, Total: 7.5 mg/dL — ABNORMAL LOW (ref 8.5–10.1)
Chloride: 110 mmol/L — ABNORMAL HIGH (ref 98–107)
Co2: 31 mmol/L (ref 21–32)
Creatinine: 0.98 mg/dL (ref 0.60–1.30)
GFR CALC NON AF AMER: 59 — AB
Glucose: 180 mg/dL — ABNORMAL HIGH (ref 65–99)
Osmolality: 297 (ref 275–301)
POTASSIUM: 4.4 mmol/L (ref 3.5–5.1)
SODIUM: 143 mmol/L (ref 136–145)

## 2014-07-14 LAB — URINE CULTURE

## 2014-07-15 LAB — CBC WITH DIFFERENTIAL/PLATELET
BASOS PCT: 0.5 %
Basophil #: 0.1 10*3/uL (ref 0.0–0.1)
EOS PCT: 1 %
Eosinophil #: 0.1 10*3/uL (ref 0.0–0.7)
HCT: 22.2 % — ABNORMAL LOW (ref 35.0–47.0)
HGB: 6.7 g/dL — ABNORMAL LOW (ref 12.0–16.0)
LYMPHS ABS: 1.1 10*3/uL (ref 1.0–3.6)
Lymphocyte %: 9.6 %
MCH: 25.8 pg — AB (ref 26.0–34.0)
MCHC: 30.3 g/dL — ABNORMAL LOW (ref 32.0–36.0)
MCV: 85 fL (ref 80–100)
MONO ABS: 0.8 x10 3/mm (ref 0.2–0.9)
MONOS PCT: 6.8 %
NEUTROS PCT: 82.1 %
Neutrophil #: 9.3 10*3/uL — ABNORMAL HIGH (ref 1.4–6.5)
PLATELETS: 223 10*3/uL (ref 150–440)
RBC: 2.6 10*6/uL — AB (ref 3.80–5.20)
RDW: 15.2 % — ABNORMAL HIGH (ref 11.5–14.5)
WBC: 11.3 10*3/uL — AB (ref 3.6–11.0)

## 2014-07-15 LAB — CLOSTRIDIUM DIFFICILE(ARMC)

## 2014-07-16 ENCOUNTER — Ambulatory Visit: Payer: Self-pay | Admitting: Neurology

## 2014-07-16 LAB — CBC WITH DIFFERENTIAL/PLATELET
BASOS ABS: 0.1 10*3/uL (ref 0.0–0.1)
Basophil %: 0.5 %
Eosinophil #: 0.1 10*3/uL (ref 0.0–0.7)
Eosinophil %: 1.2 %
HCT: 27.3 % — ABNORMAL LOW (ref 35.0–47.0)
HGB: 8.8 g/dL — AB (ref 12.0–16.0)
Lymphocyte #: 1.2 10*3/uL (ref 1.0–3.6)
Lymphocyte %: 9.6 %
MCH: 27.4 pg (ref 26.0–34.0)
MCHC: 32.2 g/dL (ref 32.0–36.0)
MCV: 85 fL (ref 80–100)
MONO ABS: 0.8 x10 3/mm (ref 0.2–0.9)
Monocyte %: 6.4 %
NEUTROS ABS: 10 10*3/uL — AB (ref 1.4–6.5)
Neutrophil %: 82.3 %
Platelet: 246 10*3/uL (ref 150–440)
RBC: 3.2 10*6/uL — AB (ref 3.80–5.20)
RDW: 14.9 % — ABNORMAL HIGH (ref 11.5–14.5)
WBC: 12.2 10*3/uL — AB (ref 3.6–11.0)

## 2014-07-16 LAB — BASIC METABOLIC PANEL
ANION GAP: 0 — AB (ref 7–16)
BUN: 24 mg/dL — ABNORMAL HIGH (ref 7–18)
Calcium, Total: 8.3 mg/dL — ABNORMAL LOW (ref 8.5–10.1)
Chloride: 108 mmol/L — ABNORMAL HIGH (ref 98–107)
Co2: 31 mmol/L (ref 21–32)
Creatinine: 0.89 mg/dL (ref 0.60–1.30)
EGFR (Non-African Amer.): >60
GLUCOSE: 237 mg/dL — AB (ref 65–99)
Osmolality: 289 (ref 275–301)
POTASSIUM: 5.2 mmol/L — AB (ref 3.5–5.1)
Sodium: 139 mmol/L (ref 136–145)

## 2014-07-17 LAB — CBC WITH DIFFERENTIAL/PLATELET
BASOS PCT: 0.4 %
Basophil #: 0 10*3/uL (ref 0.0–0.1)
Eosinophil #: 0.1 10*3/uL (ref 0.0–0.7)
Eosinophil %: 1 %
HCT: 31.1 % — ABNORMAL LOW (ref 35.0–47.0)
HGB: 9.8 g/dL — AB (ref 12.0–16.0)
LYMPHS PCT: 10.5 %
Lymphocyte #: 1.3 10*3/uL (ref 1.0–3.6)
MCH: 27.1 pg (ref 26.0–34.0)
MCHC: 31.4 g/dL — AB (ref 32.0–36.0)
MCV: 86 fL (ref 80–100)
MONOS PCT: 5.7 %
Monocyte #: 0.7 x10 3/mm (ref 0.2–0.9)
Neutrophil #: 10.5 10*3/uL — ABNORMAL HIGH (ref 1.4–6.5)
Neutrophil %: 82.4 %
Platelet: 299 10*3/uL (ref 150–440)
RBC: 3.6 10*6/uL — AB (ref 3.80–5.20)
RDW: 15.1 % — AB (ref 11.5–14.5)
WBC: 12.7 10*3/uL — AB (ref 3.6–11.0)

## 2014-07-17 LAB — BASIC METABOLIC PANEL
Anion Gap: 5 — ABNORMAL LOW (ref 7–16)
BUN: 21 mg/dL — AB (ref 7–18)
Calcium, Total: 8.5 mg/dL (ref 8.5–10.1)
Chloride: 109 mmol/L — ABNORMAL HIGH (ref 98–107)
Co2: 26 mmol/L (ref 21–32)
Creatinine: 0.87 mg/dL (ref 0.60–1.30)
EGFR (African American): >60
GLUCOSE: 128 mg/dL — AB (ref 65–99)
OSMOLALITY: 284 (ref 275–301)
Potassium: 4.7 mmol/L (ref 3.5–5.1)
Sodium: 140 mmol/L (ref 136–145)

## 2014-07-17 LAB — MAGNESIUM: MAGNESIUM: 1.5 mg/dL — AB

## 2014-07-19 LAB — BASIC METABOLIC PANEL
Anion Gap: 5 — ABNORMAL LOW (ref 7–16)
BUN: 17 mg/dL (ref 7–18)
Calcium, Total: 8.4 mg/dL — ABNORMAL LOW (ref 8.5–10.1)
Chloride: 114 mmol/L — ABNORMAL HIGH (ref 98–107)
Co2: 24 mmol/L (ref 21–32)
Creatinine: 0.94 mg/dL (ref 0.60–1.30)
EGFR (African American): >60
EGFR (Non-African Amer.): >60
Glucose: 115 mg/dL — ABNORMAL HIGH (ref 65–99)
Osmolality: 287 (ref 275–301)
Potassium: 4.1 mmol/L (ref 3.5–5.1)
Sodium: 143 mmol/L (ref 136–145)

## 2014-07-19 LAB — CBC WITH DIFFERENTIAL/PLATELET
BASOS ABS: 0.1 10*3/uL (ref 0.0–0.1)
Basophil %: 0.9 %
EOS ABS: 0.2 10*3/uL (ref 0.0–0.7)
Eosinophil %: 1.8 %
HCT: 27.5 % — AB (ref 35.0–47.0)
HGB: 8.5 g/dL — ABNORMAL LOW (ref 12.0–16.0)
Lymphocyte #: 1.3 10*3/uL (ref 1.0–3.6)
Lymphocyte %: 15.3 %
MCH: 27 pg (ref 26.0–34.0)
MCHC: 31 g/dL — ABNORMAL LOW (ref 32.0–36.0)
MCV: 87 fL (ref 80–100)
MONO ABS: 0.7 x10 3/mm (ref 0.2–0.9)
Monocyte %: 8.9 %
NEUTROS ABS: 6.2 10*3/uL (ref 1.4–6.5)
Neutrophil %: 73.1 %
PLATELETS: 277 10*3/uL (ref 150–440)
RBC: 3.16 10*6/uL — AB (ref 3.80–5.20)
RDW: 15.3 % — ABNORMAL HIGH (ref 11.5–14.5)
WBC: 8.4 10*3/uL (ref 3.6–11.0)

## 2014-08-22 ENCOUNTER — Ambulatory Visit: Payer: Self-pay | Admitting: Family Medicine

## 2014-11-18 ENCOUNTER — Ambulatory Visit: Payer: Self-pay | Admitting: Family Medicine

## 2015-02-07 NOTE — Consult Note (Signed)
PATIENT NAME:  Whitney Hernandez, Whitney Hernandez MR#:  [id number] DATE OF BIRTH:  April 19, 1941  DATE OF CONSULTATION:  01/25/2013  REFERRING PHYSICIAN:      Max Sane, MD CONSULTING PHYSICIAN:  A. Lavone Orn, MD  CHIEF COMPLAINT: Hypoglycemia.   HISTORY OF PRESENT ILLNESS: The patient is a 74 year old female with a history of type 2 diabetes who was admitted yesterday with recurrent hypoglycemia. The patient was interviewed today in the presence of her partner. He lives with her and administers her insulin. The patient has dysphasia due to a prior CVA and was a poor historian. She reports a 34 or more year history of type 2 diabetes. This has been managed with Lantus for the last year and a half. Due to her stroke, she developed difficulty swallowing and was no longer able to tolerate oral diabetes medications. She was taking Lantus 28 units as of about 10 days ago, however, due to hypoglycemia, her primary care provider, Dr. Dorthea Cove, reduced her dose gradually. For the last 4 days, she had been taking 12 units of Lantus at bedtime. Despite this she had persistent hypoglycemia. Lows had occurred in the late evening and overnight. She apparently fell out of bed 4 days ago with a low blood sugar, she did not have much warning symptoms. On the day of admission she had had altered mental status due to hypoglycemia. She was found to have blood sugars in the 60s by EMS. She was brought from home to the ED.  She initially requiring IV dextrose to maintain normal glycemia. No Lantus has been given. She did have a fasting blood sugar today of 69. She denies having any symptoms with that episode of hypoglycemia. Since her stroke, oral intake has been poor. She has lost 35 to 40 pounds. She is only able to eat a thickened liquid diet. Since this morning, blood sugars have been in the range of 85 to 102.   PAST MEDICAL HISTORY: 1.  Type 2 diabetes.  2.  Stroke August 2012.  3.  Hypertension.  4.  Coronary disease.  5.   Anemia.   PAST SURGICAL HISTORY: 1.  Hysterectomy.  2.  Left shoulder surgery.  3.  G-tube placement after stroke, removed 6 months ago.   OUTPATIENT MEDICATIONS: 1.  Lantus 12 units at bedtime.  2.  Aspirin 81 mg daily.  3.  Atorvastatin 20 mg at bedtime.  4.  Coreg 6.25 mg daily.  5.  Citalopram 20 mg daily.  6.  Plavix 75 mg daily.  7.  Lasix 20 mg daily.  8.  Sanctura 20 mg twice a day.   SOCIAL HISTORY: The patient lives at home with her female partner. She does not smoke or drink alcohol.   FAMILY HISTORY: Positive for diabetes.   REVIEW OF SYSTEMS:  CONSTITUTIONAL:  Denies pain denies fever.  HEENT: Denies blurred vision. She does have difficulty with swallowing. She has difficulty with speech.  NECK: Denies neck pain.  CARDIAC: Denies chest pain. Denies palpitations.  PULMONARY: Denies shortness of breath or cough.  ABDOMEN: Appetite is decreased. No change in bowel habits.  EXTREMITIES: Denies leg swelling.  SKIN: Denies rash. Denies pruritus.  ENDOCRINE:  Endo denies heat or cold intolerance.  HEMATOLOGIC/LYMPHATIC:  Denies easy bruisability or recent bleeding.  MUSCULOSKELETAL: Denies arthralgias or myalgias.   PHYSICAL EXAMINATION: VITAL SIGNS: Height 65.9 inches, weight 145 pounds, BMI 23.4, temperature 98.7, pulse 75, respirations 18, blood pressure 100/58, pulse oximetry 97% on room air.  GENERAL: Thin African  American female in no acute distress.  HEENT: Extraocular movements are intact.  Oropharynx is clear. NECK: Supple. No thyromegaly.  CARDIAC: Regular rate and rhythm. No carotid bruit.  LUNGS: Clear to auscultation bilaterally. No wheeze.   ABDOMEN: Diffusely soft, nontender.  EXTREMITIES: No edema is present.  SKIN: No rash or dermatopathy appreciated.  PSYCHIATRIC: Alert, oriented.  NEUROLOGIC: She has dysarthria and speech is difficult to understand.   LABORATORY AND DIAGNOSTIC DATA: Glucose 71, BUN 26, creatinine 1.54, sodium 142, potassium 4.8,  chloride 110, EGFR 38.   Labs on 01/24/2013 showed creatinine 1.92, EGFR 25, albumin 3.3, platelets 113, hematocrit 32.2%.   ASSESSMENT: 1.  Type 2 diabetes with hypoglycemia, recurrent.  2.  History of cerebrovascular accident with residual effects of stroke including dysarthria and dysphasia.  3.  Long-term use of insulin.  4.  Anemia.  5.  Renal insufficiency, EGFR 30.   PLAN:  Per history and physical, she recently had an A1c in the 5.1% to 6% range which is fairly low. Given her history of lack of symptoms until blood sugar is severely low, I am concerned about hypoglycemia unawareness, which would suggest long-term recurrent hypoglycemia. Her last dose of insulin was about 30 hours prior to this episode of hypoglycemia this morning and that was only after a fairly low dose of insulin. It may be that due to her weight loss of 35 to 40 pounds since her stroke in August of 2012, no longer be required medications to control blood sugars. I recommend continuing to hold insulin as you are doing.  I recommend continued regular blood sugar monitoring 3 to 4 times per day. If blood sugars remain reasonably controlled, under 180 in the next 24 hours, could consider letting her go home without any insulin. Renal insufficiency prohibits use of metformin and I do not recommend sulfonoureas due to her hypoglycemia.  [facility] and would be able to see her PCP in follow-up there on Monday.  Patient's partner agreed to monitor blood sugars after discharge on a regular basis. She was encouraged to increase her oral intake which hopefully will stave off further weight loss.  Thank you for the kind request for consultation. I will follow along with you.     ____________________________ A. Lavone Orn, MD ams:ct D: 01/25/2013 16:04:03 ET T: 01/25/2013 16:59:41 ET JOB#: [id number]  cc: A. Lavone Orn, MD, <Dictator> Vipul S. Manuella Ghazi, MD Gracy Bruins Diego Delancey  MD ELECTRONICALLY SIGNED 01/30/2013 17:20

## 2015-02-07 NOTE — H&P (Signed)
PATIENT NAME:  Whitney Hernandez, Whitney Hernandez MR#:  [id number] DATE OF BIRTH:  Feb 18, 1941  DATE OF ADMISSION:  01/24/2013  PRIMARY CARE PHYSICIAN: Dr. Lavetta NielsenJane Hollingsworth.   REQUESTING PHYSICIAN: Dr. Suella BroadLinda Taylor.   CHIEF COMPLAINT: Low blood sugar.   HISTORY OF PRESENT ILLNESS: The patient is a 74 year old female with a known history of hypertension, CVA and diabetes, is being admitted for recurrent hypoglycemia. The patient was here in the Emergency Department last Sunday with hypoglycemia, when her sugar was down in the 50s. Her insulin Lantus was reduced from 28 units to 26 units by the ED physician, and she was discharged back to her home and she was asymptomatic. She had a followup with her primary care physician on Monday by Dr. Shawn StallHollingsworth, and she reduced her insulin from 26 to 12 units on Monday. Since then, her sugar had been doing well and ranging anywhere from 120 to 150s, until this morning when she was going to bathroom and felt really dizzy and sat down. EMS was called by [facility] and her sugar was checked and was found to be 60. She was given oral dextrose and IV D50. On recheck, her sugar was 200. She was brought down to the Emergency Department. While in the ED, she still had low sugar and was requested to be observed considering her recurrent hypoglycemia. She is being admitted for further evaluation and management.   PAST MEDICAL HISTORY:  1.  Hypertension.  2.  Diabetes.  3.  Hyperlipidemia.  4.  History of stroke. 5.  History of dysphagia.  6.  History of heart failure. 7.  History of MI.  8.  Coronary artery disease.   ALLERGIES: CONTRAST MEDIA, MORPHINE, PENICILLIN.   MEDICATIONS AT HOME:  1.  Aspirin 81 mg p.o. daily.  2.  Atorvastatin 20 mg p.o. at bedtime.  3.  Coreg 6.25 mg p.o. daily.  4.  Citalopram 20 mg p.o. daily. 5.  Clopidogrel 75 mg p.o. daily.  6.  Lasix 20 mg p.o. daily. 7.  Sanctura 20 mg p.o. b.i.d.   SOCIAL HISTORY: No smoking. No alcohol. No IV drugs  of abuse. [facility]   PAST SURGICAL HISTORY:  1.  Total abdominal hysterectomy.  2.  Tonsillectomy.  3.  Gallbladder surgery.  4.  Right shoulder surgery.  5.  G-tube placement.   FAMILY HISTORY: Two sisters with diabetes.   REVIEW OF SYSTEMS:    CONSTITUTIONAL: No fever, fatigue, weakness.  EYES: No blurred or double vision.  EARS, NOSE, THROAT: No tinnitus or ear pain. She does have a history of dysphagia, for which she had a G-tube placed.  RESPIRATORY: No cough, wheezing, hemoptysis.  CARDIOVASCULAR: No chest pain, orthopnea, edema.  GASTROINTESTINAL: No nausea, vomiting, diarrhea.  GENITOURINARY: No dysuria hematuria.  ENDOCRINE: No polyuria or nocturia. Recurrent hypoglycemia.  HEMATOLOGIC: No anemia or easy bruising.  SKIN: No rash or lesion.  MUSCULOSKELETAL: No arthritis or muscle cramps.  NEUROLOGICAL: She had a presyncopal episode. She also has a history of stroke.  PSYCHIATRY: Positive for anxiety and insomnia.   PHYSICAL EXAMINATION:  VITAL SIGNS: Temperature 97.4, heart rate 65 per minute, respirations 20 per minute, blood pressure 124/67. She is saturating 99% on room air. GENERAL: The patient is a 74 year old female lying in the bed comfortably without any acute distress.  EYES: Pupils equal, round, reactive to light and accommodation. No scleral icterus. Extraocular muscles intact.  HEAD, EARS, NOSE, THROAT: Head atraumatic, normocephalic. Oropharynx and nasopharynx clear.  NECK:  Supple. No jugular venous distention. No thyroid enlargement or tenderness.  LUNGS: Clear to auscultation bilaterally. No wheezing, rales, rhonchi or crepitation.  ABDOMEN: Soft, nontender, nondistended. Bowel sounds present. No organomegaly or masses.  EXTREMITIES: No pedal edema, cyanosis or clubbing.  NEUROLOGICAL: She has an old left facial droop and some speech disturbances, which are from her previous stroke and are at her baseline. Muscle strength 5/5 in all  extremities. Sensation intact.  PSYCHIATRIC: The patient is alert and oriented x 3.   LABORATORY AND RADIOLOGICAL DATA: Normal BMP except BUN of 31, creatinine 1.92. Normal liver function tests. Normal CBC except hemoglobin of 10.3, hematocrit 32.2, platelets 113.   She had a CT scan of the head on the 6th of April when she was here in the ED, which was negative for any acute changes. She did have chronic small vessel ischemia.   Chest x-ray at that time on the 6th of April was also negative for any acute cardiopulmonary disease.   IMPRESSION AND PLAN:  1.  Recurrent hypoglycemia, likely due to poor oral intake. Her last hemoglobin A1c was running anywhere from 5.1 to 6 as per Dr. Shawn Hernandez, with whom I had a discussion on phone. I also  discussed the case with the patient's daughter, who was at the bedside. She agrees that her blood sugars are under variable control and had been running anywhere from 100 to 150 typically at home, but had been dropping recently. Will stop all insulin. Check her sugar every 2 hours for now for the next 3 times until her sugar is more than 100. Then we will stop dextrose fluid and check sugars every 4 hours for the next 4 times. As of now, she will be started on D5 normal saline. If her sugar stays more than 100 without any symptoms, she can possibly go home tomorrow.  2.  Acute renal failure, likely prerenal due to poor oral intake. We will start her on intravenous hydration, stop her Lasix and monitor her renal function.  3.  Presyncope, likely due to recurrent hypoglycemia. We will check her sugar and monitor her closely. Will get nurse to ambulate her as needed.  4.  Hypertension: Blood pressure is stable. Will monitor. Hold Lasix.  5.  Code status: Full code.   The patient's management plan was discussed with the patient's daughter, Whitney Hernandez [place], who agrees with the plan. This was also discussed with her primary care physician, Dr. Shawn Hernandez from [facility], who agrees with the same. Care management was updated. Amy's number is [phone], and she would like to get updated regarding her discharge planning.  TIME SPENT: Total time taking care of this patient was 55 minutes.   ____________________________ Ellamae Sia. Sherryll Burger, MD vss:jm D: 01/24/2013 17:09:24 ET T: 01/24/2013 18:07:05 ET JOB#: [id number]  cc: Braxtyn Dorff S. Sherryll Burger, MD, <Dictator> Kristopher Oppenheim. Whitney Stall, MD Ellamae Sia [facility] MD ELECTRONICALLY SIGNED 01/27/2013 10:36

## 2015-02-07 NOTE — Discharge Summary (Signed)
PATIENT NAME:  Whitney Hernandez, Whitney Hernandez MR#:  [id number] DATE OF BIRTH:  Dec 31, 1940  DATE OF ADMISSION:  01/24/2013  DATE OF DISCHARGE:  01/26/2013  DISCHARGE DIAGNOSES: 1.  Recurrent hypoglycemia, likely due to poor p.o. intake. Stop all kind of insulin and recommended checking sugar at least 2 to 3 times a day at home, per endocrinology. Also encouraged to take more oral intake and keep orange juice and sweet candies or sugary substances handy.  2.  Acute renal failure, now resolved with creatinine of 1.2 on the date of discharge, likely due to poor p.o. intake, resolved with IV hydration.  3.  Presyncope, likely due to recurrent hypoglycemia, now doing well. No more dizziness, and worked well with therapist. No skilled needs.   SECONDARY DIAGNOSES: 1.  Hypertension.  2.  Diabetes.  3.  Hyperlipidemia.  4.  History of cerebrovascular accident.  5.  Heart failure.  6.  Myocardial infarction.  7.  Coronary artery disease.   CONSULTATIONS:  1.  Endocrine, Dr. Tedd SiasSolum.  2.  Physical therapy.   PROCEDURES AND RADIOLOGY: None.   MAJOR LABORATORY PANEL:  Insulin plus C-peptide was within normal limits on the 11th of April.   HISTORY AND SHORT HOSPITAL COURSE: The patient is a 74 year old female with the above-mentioned medical problems, who was admitted for recurrent hypoglycemia. Please see Dr. Margaretmary EddyShah's dictated history and physical for further details. The patient was started on D5 normal saline and intensive blood sugar check was performed while in the hospital. The patient was taken off D5 within 24 hours as her sugars started somewhat improving. She was seen by dietary  and was instructed to have better oral intake. She was also evaluated by endocrine, Dr. Tedd SiasSolum, who recommended stopping all kind of insulin and checking blood sugar at home at least 2 to 3 times a day. The patient was evaluated by physical therapy, and she was not required to have any skilled needs at home. She was doing much better and  was discharged home on 11th of April in stable condition on the date of discharge.   PERTINENT PHYSICAL EXAMINATION ON DATE OF DISCHARGE: VITAL SIGNS: Temperature 99, heart rate 74 per minute, respirations 18 per minute, blood pressure 100/64 mmHg. She was saturating 97% on room air.  CARDIOVASCULAR: S1, S2 normal. No murmurs, rubs or gallop.  LUNGS: Clear to auscultation bilaterally. No wheezing, rales, rhonchi or crepitation.  ABDOMEN: Soft, benign.  NEUROLOGIC: Nonfocal examination. All other physical examination remained at baseline.   DISCHARGE MEDICATIONS: 1.  Aspirin 81 mg p.o. daily.  2.  Coreg 6.25 mg p.o. daily.  3.  Citalopram 20 mg p.o. daily.  4.  Lasix 20 mg p.o. daily.  5.  Plavix 75 mg p.o. daily.  6.  Sanctura 20 mg p.o. b.i.d.  7.  Lipitor 20 mg p.o. at bedtime.  8.  Lipitor 20 mg p.o. every morning.  9.  Ensure Plus 3 times a day with each meal.   DISCHARGE DIET: Regular with syringes feeding as she had been doing at home. Monitor for signs of aspiration like cough, gurgly voice or throat cleaning,  fever, upper respiratory tract infection and/or pneumonia. If she notices any of those symptoms or signs, please call [facility] and get speech therapy evaluation.   DISCHARGE ACTIVITY: As tolerated.   DISCHARGE INSTRUCTIONS AND FOLLOWUP: The patient was instructed to follow up with her primary physician, Dr. Lavetta NielsenJane Hollingsworth with [facility] in 1 to 2 weeks.    TOTAL TIME  DISCHARGING THIS PATIENT:  Was 55 minutes.     ____________________________ Ellamae Sia. Sherryll Burger, MD vss:dmm D: 01/29/2013 11:43:42 ET T: 01/29/2013 11:51:58 ET JOB#: [id number]  cc: Vonte Rossin S. Sherryll Burger, MD, <Dictator> Kristopher Oppenheim. Shawn Stall, MD A. Wendall Mola, MD Patricia Pesa MD ELECTRONICALLY SIGNED 01/29/2013 12:10

## 2015-02-07 NOTE — Consult Note (Signed)
Chief Complaint and History:  Referring Physician Dr. Manuella Ghazi   Chief Complaint Diabetes and recurrent hypoglycemia   Allergies:  Morphine: Other  Penicillin: Other  contrast media (gadolinium-based): Other  Assessment/Plan:  Assessment/Plan Patient was seen, examined, and chart was reviewed. This is a 74 yr old female with a 28 yr h/o diabetes. Managed at home with Lantus. Lives with female friend who adminsters insulin. Had been on a decreasing dose in last week due to lows. Despite decreasing from 28 to 12 units, which she'd be on for last 3 days, she has had persistent low sugars. No insulin given eysterday yet FSBS today was 69. She is not symptomatic until sugar is in the 40s or less. Apparently had A1c of 5.1-6%.  Does not eat well since CVA. Eats only thickened liquids. She has lost about 35 lbs in last 20 months.  A/ Diabetes type 2 with recurrent hypoglycemia Renal insufficiency (eGFR 30s) History of CVA On long-term insulin  P/ Persistent hypoglycemia despite holding insulin -- possibly due to lingering effects of insulin in setting of chronic renal insufficiency. As A1c was quite low, I agree with holding insulin. If sugars remain nml in next 24 hours or so, would send home without the resumption of any insulin. Pt [facility] and can see her PCP there next Monday. Advised her female friend to monitor her sugars 2-3 times daily. No additional treatment at this time. She should be encouraged to inc her PO intake, as tol. I will follow with you. Full consult to be dictated.   Electronic Signatures: Judi Cong (MD)  (Signed 10-Apr-14 13:53)  Authored: Chief Complaint and History, ALLERGIES, Assessment/Plan   Last Updated: 10-Apr-14 13:53 by Judi Cong (MD)

## 2015-02-08 NOTE — Op Note (Signed)
PATIENT NAME:  Whitney Hernandez, Whitney Hernandez MR#:  [id number] DATE OF BIRTH:  1941-02-26  DATE OF PROCEDURE:  07/06/2014  ATTENDING PHYSICIAN: Cristal Deerhristopher A. Sheelah Ritacco, MD.  PREOPERATIVE DIAGNOSIS: Cecal volvulus.   POSTOPERATIVE DIAGNOSIS: Cecal volvulus with necrosis.   PROCEDURE PERFORMED: Right hemicolectomy.   ANESTHESIA: General.   ESTIMATED BLOOD LOSS: 25 mL.   COMPLICATIONS: None.   SPECIMENS: Right colon.   INDICATION FOR SURGERY: Ms Whitney Hernandez is a pleasant, 74 year old female with history of abdominal pain and what was initially thought to be a sigmoid volvulus. CT scan after decompression and rectal tube placement showed no resolution of volvulus, so the belief was that the volvulus was indeed sigmoid. She was thus brought to the operating room for exploratory laparotomy and possible right hemicolectomy.   DETAILS OF PROCEDURE: Informed consent was obtained. Ms Whitney Hernandez was brought to the operating room suite. She was induced. Endotracheal tube was placed. General anesthesia was administered. Her abdomen was prepped and draped in the standard surgical fashion. A timeout was then performed, correctly identifying the patient's name, operative site and procedure to be formed. A midline incision was made. It was deepened down to the fascia. The fascia was incised. There was a large loop of dark black bowel, which was found in the right lower quadrant. This was dissected out and was found to be twisted on the mesentery of the cecum. There was viable, relatively nondilated bowel proximal and distal to this. The decision was made to do a right hemicolectomy. Both proximal and distal ends of bowel were ligated with a GIA 75. The Harmonic scalpel was used to ligate the mesentery and bring the bowel out onto the field.   The terminal ileum and mid transverse colon were reanastomosed using a stapled GIA 75 to create a common channel and then a TX 60 to close the enterotomy in a side-to-side functional  end-to-end fashion. The bowel was then returned to the abdomen. The abdomen was irrigated with 6 liters of normal saline. The midline incision was then closed with a 0 PDS looped, run from 1 direction and tied at the other. There was mesh noted on closure from previous hernia repair. The skin was then stapled, but staples were left far apart and there were wicks placed in the midline wound to try to prevent any mesh infection or wound infection. Sterile dressing was then placed over the wound. She was then awoken, extubated and brought to the postanesthesia care unit. There were no immediate complications. Needle, sponge and instrument counts were correct at the end of the procedure.     ____________________________ Si Raiderhristopher A. Rommel Hogston, MD cal:TT D: 07/07/2014 08:19:01 ET T: 07/07/2014 14:31:28 ET JOB#: [id number]  cc: Cristal Deerhristopher A. Larrisa Cravey, MD, <Dictator> Jarvis NewcomerHRISTOPHER A Lavontae Cornia MD ELECTRONICALLY SIGNED 07/17/2014 0:59

## 2015-02-08 NOTE — H&P (Signed)
PATIENT NAME:  Whitney Hernandez, Whitney Hernandez MR#:  [id number] DATE OF BIRTH:  1941/06/27  DATE OF ADMISSION:  11/30/2013  PRIMARY CARE PHYSICIAN: Dr. Lavetta Nielsen at [facility] program.   CHIEF COMPLAINT: History is obtained from the patient's daughter, [name] [place], over the phone. Her number is [phone]. The patient is a poor historian, unable to provide any history at this time.   Whitney Hernandez is a 74 year old African American female who lives at home with her friend, goes to [facility] on a daily basis. Today started having both eyes blurred vision, double vision, and speech was noted to be slurred by family members. She did report some dizziness for 1 week, nausea for 1 week, and intermittent vomiting. The patient has had a lot of oral secretions as well. She is on pureed diet. She mainly drinks thickened Ensure, and sometimes she will drink water per daughter. She was brought to the Emergency Room. She had temperature of 102.8 in the Emergency Room and earlier had blood pressure of 86/57. She was found to have significant urinary tract infection along with dehydration with creatinine of 2.2. Her baseline creatinine is 1.1. Chest x-ray also showed lingular pneumonia on the left. She is being admitted with SIRS secondary to UTI, pneumonia, renal failure, and possibly increased slurred speech with underlying new CVA versus TIA.   PAST MEDICAL HISTORY: 1.  CVA.  2.  Hypertension.  3.  Diabetes.  4.  History of MI.  5.  History of stroke in 2011. Underwent PEG tube placement. which was removed in 2013. The patient currently is on thickened Ensure and drinks water occasionally. She does not eat much per daughter.  6.  History of coronary artery disease.   ALLERGIES: CONTRAST MEDIA, LEVAQUIN, MORPHINE, NSAIDS, PENICILLIN.   FAMILY HISTORY: Two sisters have diabetes.   SOCIAL HISTORY: Nonsmoker, nonalcoholic. Lives at home with a friend.   REVIEW OF SYSTEMS: Not much obtainable, patient not able to  talk. She is very soft spoken and does have slurred speech.   PHYSICAL EXAMINATION: GENERAL: The patient is awake. She is alert. She is oriented x 2. She is very thin, cachectic, not in acute distress.  VITAL SIGNS: Temperature at present is 102.8. Blood pressure is 121/59. Pulse oximetry is 94% on 2 liters. Pulse is 84.  HEENT: Atraumatic, normocephalic. Pupils: PERRLA. EOM intact. Oral mucosa is dry, a lot of oral secretions, poor dentition.  NECK: Supple. No JVD. No carotid bruits.  RESPIRATORY: Decreased breath sounds in the bases. No respiratory distress, rales, rhonchi, or are audible wheezing.  CARDIOVASCULAR: Both heart sounds are normal. Rate, rhythm regular. PMI not lateralized. Chest nontender.  EXTREMITIES: Good pedal pulses, good femoral pulses. No lower extremity edema.  ABDOMEN: Scaphoid, thin, cachectic. No organomegaly. Positive bowel sounds.   NEUROLOGIC: Limited, again due to patient understanding and cooperation. She is right-handed. No focal neuro deficit noted. She has a lot of slurred speech and a lot of oral secretions. She is overall weak. Reflexes are sluggish both lower extremities, deep tendon jerks, and plantars are downgoing. Gait, sensory exam, and cerebellar testing not done secondary to patient not understanding and unable to do those tests.  SKIN: Warm and dry.  PSYCHIATRIC: The patient is awake, alert, oriented x 2.   MEDICATION LIST:  1.  Zovirax 5% ointment 4 times a day as needed.  2.  Zofran ODT 4 mg 1 tablet by mouth t.i.d. as needed.  3.  Aspercreme 10%, apply to affected area 4 times a  day as needed.  4.  Ensure liquid twice a day.  5.  Hydrocortisone 1% cream, apply to soles of both feet b.i.d.  6.  Lumigan 0.01% solution 1 drop in right eye each night.  7.  Glutose 40% gel, give contents of 1 tube as needed for low blood sugar.  8.  Carvedilol 3.125  p.o. daily.  9.  Senokot 8.6/50, 2 tablets b.i.d.  10.  Dulcolax 10 mg rectal as needed.  11.   Ensure Plus liquid 4 times a day.  12.  GlycoLax powder b.i.d. to t.i.d.  13.  Nitrostat 0.4 mg sublingual as needed.  14.  Lipitor 20 mg p.o. daily.  15.  Prevacid SoluTab 30 mg 1 tablet daily.  16.  Sanctura 20 mg p.o. b.i.d.  17.  Alprazolam 0.25 mg p.o. every 8 hours as needed.  18.  Transderm scopolamine 1.5 mg 1 patch behind ear every 3 days.  19.  Celexa 20 mg daily.  20.  Plavix 75 mg daily.  21.  Lasix 20 mg daily.  22.  Docusate 150 mg p.o. daily.   LABORATORY AND RADIOLOGICAL DATA: Urinalysis positive for UTI.    CT of the abdomen shows lingular pneumonia, no pneumoperitoneum, gaseous distention of colon likely accounting for radiographic appearance, left renal cyst.   Chest x-ray shows lingular pneumonia in the left lower lobe.   CT of the head: Atrophy with prior infarcts and small vessel disease. No intracranial mass, hemorrhage, or acute-appearing infarct.   White count is 9.7; hemoglobin and hematocrit are 10.7 and 32.0; platelet count is 112. Glucose is 169, BUN 46; creatinine is 2.2; sodium is 127; potassium is 5; chloride is 93. LFTs within normal limits. Albumin is 3.4.   ASSESSMENT AND PLAN: A 74 year old Ms. Reddington with history of cerebrovascular accident, hypertension, diabetes, coronary artery disease, [facility] in [facility] with:  1.  Systemic inflammatory response syndrome secondary to urinary tract infection and left lingular pneumonia: The patient is going to be admitted on medical floor with telemetry. She will be started on IV Rocephin and Zithromax. We will follow up blood cultures, sputum cultures. Follow the fever curve. She came in with fever of 102.8 and was hypotensive with blood pressure of 86/57. Her sats are stable at this time at 96% on 2 liters. Will monitor urine cultures as well, narrow down antibiotic accordingly.  2.  Increasing slurred speech with increased oral secretions in the setting of history of cerebrovascular accident with chronic  dysphagia in the past: Will order MRI of the brain to ensure patient did not have another mini stroke. Continue aspirin and Plavix once cleared by speech to take oral medications. CT head was negative for any acute infarct.  3.  Hypertension: Will resume home medication, which is Coreg.  4.  Type 2 diabetes: The patient has had episodes of hypoglycemia. Has been off diabetic medications. Will continue to monitor with sliding scale insulin.  5.  Deep vein thrombosis prophylaxis: The patient has history of chronic thrombocytopenia. She is already on aspirin and Plavix, which I will continue. I would not add any other additional antiplatelet agents at this time.  6.  Acute renal failure, which is likely due to poor oral intake: Her baseline creatinine is 1.2. She comes in with creatinine of 2.2. Will continue IV hydration.  7.  Speech therapy to see patient for speech evaluation/swallowing evaluation given history of chronic dysphagia.  8.  Further work-up according to patient's clinical course. Hospital admission  plan was discussed with the patient's daughter, Linton [place]. Her contact number is [phone].    ____________________________ Wylie HailSona A. Allena KatzPatel, MD sap:jcm D: 11/30/2013 16:36:07 ET T: 11/30/2013 17:20:10 ET JOB#: [id number]  cc: Camisha Srey A. Allena KatzPatel, MD, <Dictator> Kristopher OppenheimJane D. Shawn StallHollingsworth, MD Willow OraSONA A Apple Dearmas MD ELECTRONICALLY SIGNED 12/06/2013 17:22

## 2015-02-08 NOTE — Consult Note (Signed)
Pt seen and examined. Please see Dawn Harrison's notes. Once patient, family, and POA in agreement, will proceed with PEG on Friday. Either will use previous G site or a new site. Thanks  Electronic Signatures: Lutricia Feilh, Yvone Slape (MD)  (Signed on 18-Feb-15 15:29)  Authored  Last Updated: 18-Feb-15 15:29 by Lutricia Feilh, Alayzia Pavlock (MD)

## 2015-02-08 NOTE — Consult Note (Signed)
Brief Consult Note: Diagnosis: SIRS secondary to UTI and pneumonia, aspiration.  Failed MBSS.  Positive for aspiration.  History of CVA.   Consult note dictated.   Orders entered.   Discussed with Attending MD.   Comments: Patient's presentation discussed with Dr. Lutricia FeilPaul Oh.  Recommendation is to proceed with PEG tube placement tomorrow am.  I have spoken with patient's daughter, Amy [place] who is POA, she is going to come back to hospital today and sign consent.  Order placed.  NPO after midnight.  Continue with antibiotic therapy.  Miscellanous nursing order placed to hold ASA and Heparin in the am.  Will continue to follow.  Electronic Signatures: Rodman KeyHarrison, Stacey Sago S (NP)  (Signed 18-Feb-15 14:26)  Authored: Brief Consult Note   Last Updated: 18-Feb-15 14:26 by Rodman KeyHarrison, Montine Hight S (NP)

## 2015-02-08 NOTE — Discharge Summary (Signed)
PATIENT NAME:  Whitney Hernandez, Whitney Hernandez MR#:  [id number] DATE OF BIRTH:  [birth year]  DATE OF ADMISSION:  07/04/2014 DATE OF DISCHARGE:  07/19/2014  BRIEF HISTORY: [name] is a 74 year old woman admitted on September 17 to the medical service with abdominal pain. She was felt to have a possible sigmoid volvulus. CT scan revealed distended loops of bowel suspicious for sigmoid volvulus. Surgery was consulted and Dr. Juliann PulseLundquist assisted in her care. She had a colonoscopy and that evening which did not demonstrate any evidence of sigmoid volvulus and a rectal tube was placed. There did not appear to be any evidence of obstruction on the examined colon. On the following day, repeat films were performed and she did not appear to have any improvement. We recommend a repeat CT scan with a rectal tube in place. It was apparent that with that procedure and imaging that the rectal tube did not cross the distal colon. The patient was taken urgently to surgery on 07/06/2014 where she underwent a laparotomy. She was found to have a cecal volvulus. A formal right colectomy was performed. She had a very complicated postoperative course. She had difficulty with mental status issues, pulmonary insufficiency with significant pneumonia, and eventual seizure disorder. She was seen by the neurologist. There did not appear to be any evidence of an ongoing seizure problem, and CT scan of her abdomen was unremarkable. She is discharged today to skilled nursing to be followed up by Dr. Juliann PulseLundquist and her primary care doctor.   DISCHARGE MEDICATIONS: Include Ilevro 0.3% ophthalmic solution to the right eye, lorazepam 0.5 mg in her PEG tube every 12 hours, docusate 10 mg per mL 20 mg once a day in her PEG tube, senna 0.6 mg 2 tablets per PEG tube twice a day, Maalox advanced strength 20 mg PEG tube 3 times a day, metformin 500 mg PEG tube once a day, acetaminophen 500 mg PEG tube 3 times a day p.r.n., Zofran 4 mg PEG tube 3 times a day,  Aspercreme 10% topical solution 4 times a day p.r.n., Lumigan 0.01% ophthalmic solution right eye, glutose 15 40% oral gel for blood sugar less than 60, Dulcolax laxative suppository 10 mg once a day p.r.n., Nitrostat 0.4 mg sublingually every 15 minutes p.r.n., Lipitor 20 mg PEG tube once a day, Transderm scopolamine 1.5 mg transderm every 3 days, Celexa 20 mg PEG tube once a day, Plavix 75 mg PEG tube once a day, clindamycin 300 mg PEG tube once a day.   FINAL DISCHARGE DIAGNOSIS: Cecal volvulus.   SURGERY PERFORMED: Right colectomy.   ____________________________ Carmie Endalph L. Ely III, MD rle:at D: 07/19/2014 10:47:32 ET T: 07/19/2014 11:16:44 ET JOB#: [id number]  cc: Carmie Endalph L. Ely III, MD, <Dictator> Kristopher OppenheimJane D. Shawn StallHollingsworth, MD Quentin OreALPH L ELY MD ELECTRONICALLY SIGNED 07/19/2014 17:54

## 2015-02-08 NOTE — Op Note (Signed)
PATIENT NAME:  Whitney AzureMORROW, Olive W MR#:  [id number] DATE OF BIRTH:  [birth year]  DATE OF PROCEDURE:  04/30/2014  PREOPERATIVE DIAGNOSIS: Visually significant cataract of the right eye.   POSTOPERATIVE DIAGNOSIS: Visually significant cataract of the right eye.   OPERATIVE PROCEDURE: Cataract extraction by phacoemulsification with implant of intraocular lens to right eye.   SURGEON: Galen ManilaWilliam Ysenia Filice, MD.   ANESTHESIA:  1. Managed anesthesia care.  2. Topical tetracaine drops followed by 2% Xylocaine jelly applied in the preoperative holding area.   COMPLICATIONS: None.   TECHNIQUE:  Stop and chop.   DESCRIPTION OF PROCEDURE: The patient was examined and consented in the preoperative holding area where the aforementioned topical anesthesia was applied to the right eye and then brought back to the Operating Room where the right eye was prepped and draped in the usual sterile ophthalmic fashion and a lid speculum was placed. A paracentesis was created with the side port blade and the anterior chamber was filled with viscoelastic. A near clear corneal incision was performed with the steel keratome. A continuous curvilinear capsulorrhexis was performed with a cystotome followed by the capsulorrhexis forceps. Hydrodissection and hydrodelineation were carried out with BSS on a blunt cannula. The lens was removed in a stop and chop technique and the remaining cortical material was removed with the irrigation-aspiration handpiece. The capsular bag was inflated with viscoelastic and the Alcon Tecnis ZCB00 21.5-diopter lens, serial number [id number] was placed in the capsular bag without complication. The remaining viscoelastic was removed from the eye with the irrigation-aspiration handpiece. The wounds were hydrated. The anterior chamber was flushed with Miostat and the eye was inflated to physiologic pressure. A 4 to 1 dilution of Vigamox was placed in the anterior chamber. The wounds were found to be water  tight. The eye was dressed with Vigamox. The patient was given protective glasses to wear throughout the day and a shield with which to sleep tonight. The patient was also given drops with which to begin a drop regimen today and will follow-up with me in one day.   Please not that Cefuroxime was not placed within the eye due to penicillin allergy. Rather a 4 to 1 dilution of Vigamox was used.   ALLERGY: PENICILLIN.    ____________________________ Jerilee FieldWilliam L. Kelaiah Escalona, MD wlp:lt D: 04/30/2014 21:43:49 ET T: 04/30/2014 23:47:17 ET JOB#: [id number]  cc: Gomer France L. Daxtyn Rottenberg, MD, <Dictator> Jerilee FieldWILLIAM L Majesty Stehlin MD ELECTRONICALLY SIGNED 05/15/2014 17:07

## 2015-02-08 NOTE — H&P (Signed)
   Subjective/Chief Complaint LLQ pain x 2 weeks, N/V x 2 days   History of Present Illness [name] is a pleasant 74 yo F with a history of DM, prior CVA and MI and dysphagia requiring tube feeds who presents with 2 weeks of abdominal pain, distention and 2 days of N/V.  Prior to this was doing well.  Not able to tolerate tube feeds.  No fevers/chills.  No chest pain, shortness of breath.  CT shows distended large bowel loop, suspicious for sigmoid volvulus.   Past History H/o CVA H/o MI, CAD H/o G tube placement H/o CHF   Past Medical Health Coronary Artery Disease, Hypertension, Diabetes Mellitus, Stroke   Code Status Full Code   Past Med/Surgical Hx:  Dysphagia:   CHF:   CVA/Stroke:   MI - Myocardial Infarct:   CAD:   G tube placement:   ALLERGIES:  NSAIDS: Anaphylaxis  Levaquin: Other  Morphine: Other  Penicillin: Other  contrast media (gadolinium-based): Other  Family and Social History:  Family History Coronary Artery Disease  Hypertension   Social History negative tobacco, negative ETOH, negative Illicit drugs   + Tobacco Current (within 1 year)   Place of Living Nursing Home   Review of Systems:  Subjective/Chief Complaint Abdominal pain x 2 weeks, N/V x 2 days   Fever/Chills No   Cough No   Sputum No   Abdominal Pain Yes   Diarrhea No   Constipation No   Nausea/Vomiting Yes   SOB/DOE No   Chest Pain No   Dysuria No   Tolerating PT No   Tolerating Diet Nauseated  Vomiting   Physical Exam:  GEN well developed, well nourished, no acute distress, thin   HEENT pink conjunctivae, PERRL, hearing intact to voice   RESP normal resp effort  clear BS  no use of accessory muscles   CARD regular rate  no murmur  no thrills   ABD positive tenderness  no hernia  distended  hypoactive BS   LYMPH negative neck, negative axillae   EXTR negative cyanosis/clubbing, negative edema   SKIN normal to palpation, No rashes, No ulcers   NEURO  negative rigidity, negative tremor, follows commands, strength:, motor/sensory function intact   PSYCH A+O to time, place, person, good insight    Assessment/Admission Diagnosis 74 yo F with multiple medical issues, concern for sigmoid volvulus. Cr elevated.  Concern for intramural air.   Plan Plan detorsion via colonoscopy.  If able to successfully detorse without perforation or concern for ischemia, will plan on elective resection during this admission.  If unable to detorse or develops peritonitis or free air, will plan on emergent sigmoid colectomy with ostomy.   Electronic Signatures: Jarvis NewcomerLundquist, Raylee Strehl A (MD)  (Signed 17-Sep-15 18:26)  Authored: CHIEF COMPLAINT and HISTORY, PAST MEDICAL/SURGIAL HISTORY, ALLERGIES, FAMILY AND SOCIAL HISTORY, REVIEW OF SYSTEMS, PHYSICAL EXAM, ASSESSMENT AND PLAN   Last Updated: 17-Sep-15 18:26 by Jarvis NewcomerLundquist, [place] Millon A (MD)

## 2015-02-08 NOTE — Consult Note (Signed)
Chief Complaint:  Subjective/Chief Complaint The patient is s/p PEG tube placement. The patient has no complints.   VITAL SIGNS/ANCILLARY NOTES: **Vital Signs.:   21-Feb-15 05:37  Vital Signs Type Routine  Temperature Temperature (F) 98.5  Celsius 36.9  Temperature Source oral  Pulse Pulse 76  Respirations Respirations 18  Systolic BP Systolic BP 833  Diastolic BP (mmHg) Diastolic BP (mmHg) 71  Mean BP 91  Pulse Ox % Pulse Ox % 99  Pulse Ox Activity Level  At rest  Oxygen Delivery 2L   Brief Assessment:  GEN well developed, well nourished, no acute distress   Respiratory normal resp effort  clear BS   Gastrointestinal Normal   Gastrointestinal details normal Soft  Nontender   Lab Results: Routine Chem:  21-Feb-15 05:54   Glucose, Serum  262  BUN  59  Creatinine (comp) 1.30  Sodium, Serum  148  Potassium, Serum 4.3  Chloride, Serum  118  CO2, Serum 24  Calcium (Total), Serum 9.3  Anion Gap  6  Osmolality (calc) 320  eGFR (African American)  47  eGFR (Non-African American)  41 (eGFR values <46m/min/1.73 m2 may be an indication of chronic kidney disease (CKD). Calculated eGFR is useful in patients with stable renal function. The eGFR calculation will not be reliable in acutely ill patients when serum creatinine is changing rapidly. It is not useful in  patients on dialysis. The eGFR calculation may not be applicable to patients at the low and high extremes of body sizes, pregnant women, and vegetarians.)   Assessment/Plan:  Assessment/Plan:  Assessment Patient s/p PEG tube placement.   Plan PEG site looks good. Continue dressing changes as per protocol.   Electronic Signatures: WLucilla Lame(MD)  (Signed 21-Feb-15 12:53)  Authored: Chief Complaint, VITAL SIGNS/ANCILLARY NOTES, Brief Assessment, Lab Results, Assessment/Plan   Last Updated: 21-Feb-15 12:53 by WLucilla Lame(MD)

## 2015-02-08 NOTE — Discharge Summary (Signed)
PATIENT NAME:  [name], Whitney MR#:  [id number] DATE OF BIRTH:  [birth year]  DATE OF ADMISSION:  11/30/2013 DATE OF DISCHARGE:  12/11/2013  ADDENDUM  Addendum of earlier dictated discharge summary by Dr. Enid Baasadhika Kalisetti on 12/05/2013  FINAL DIAGNOSES: 1. Sepsis due to aspiration pneumonia and urinary tract infection, needs clindamycin until 12/13/2013.  2. Dysphagia and malnutrition, status post PEG tube placement.  3. Acute renal failure, due to poor oral intake, now resolved.  4. Hypernatremia and dehydration, resolved.  5. Diabetes.  6. Pneumonia.  CONDITION ON DISCHARGE:  Stable.   CODE STATUS: No code, do not resuscitate.    MEDICATION ON DISCHARGE:  1. Scopolamine 1.5 mg transdermal film every 72 hours.  2. Atorvastatin 20 mg oral tablet via gastric tube.  3. Clopidogrel 75 mg oral tablet once a day  4. Alprazolam 0.25 mg oral tablet every eight hours as needed for anxiety.  5. Prednisone 10 mg take 3 tablets four first day, 2 on second and 1 tablet on third day.  6. Fioricet 1 capsule oral 1 - 4  times a day as needed for headache given via gastric tube. 7. Metformin 5 mg oral tablet 2 times a day.  8. Clindamycin 300 mg oral capsule every eight hours through 02/26 through PEG tube.   DIET: Will advance per  PEG tube.  ACTIVITY: As tolerated.   FOLLOW-UP:  Advised to keep medication and follow-up appointment.   For earlier hospital course and admission history: See interim discharge summary done by Dr. Enid Baasadhika Kalisetti on 18th February 2015. Hospital course after that: On 20th of February 2015  PEG tube placement was done by GI and the patient was gradually started on PEG tube feeding with Jevity. The patient did not any problems tolerating the diet with Jevity  but her blood sugar level gradually went up and so we started her on insulin for her diabetes, Because it was weekend, till Monday we had to wait to start approval for rehab placement. After patient was approved  from insurance company we placed her and discharged her to rehab.   OTHER MEDICAL ISSUES: 1. Sepsis secondary to pneumonia and urinary tract infection, likely aspiration after having final sensitivity results from urine culture, antibiotic was changed to clindamycin orally daily for three days as per recommendation of Dr. Sampson GoonFitzgerald.  2. For hypertension,  she was on carvedilol, but we held it because of hypotension and after that, the patient remained stable.   Important lab results after 18 February: Creatinine was 1.4 on 19 February, potassium was 4.1. On 23 February, creatinine 1.19, and sodium 141, potassium 4.2. Blood sugar level is running high, but started on insulin for that.  TIME SPENT ON THIS DISCHARGE: 40 minutes.   ____________________________ Hope PigeonVaibhavkumar G. Elisabeth PigeonVachhani, MD vgv:sg D: 12/15/2013 21:51:28 ET T: 12/16/2013 08:26:23 ET JOB#: [id number]  cc: Hope PigeonVaibhavkumar G. Elisabeth PigeonVachhani, MD, <Dictator> Altamese DillingVAIBHAVKUMAR Shiraz Bastyr MD ELECTRONICALLY SIGNED 12/24/2013 7:48

## 2015-02-08 NOTE — Consult Note (Signed)
PATIENT NAME:  Whitney Hernandez, Whitney Hernandez MR#:  [id number] DATE OF BIRTH:  February 13, 1941  DATE OF CONSULTATION:  07/05/2014  CONSULTING PHYSICIAN:  Shaune Pollack, MD  PRIMARY CARE PHYSICIAN: Dr. Shawn Stall.  REFERRING PHYSICIAN: Dr. Juliann Pulse.  REASON FOR CONSULTATION: Medical management.   REVIEW OF HISTORY: The patient is a 74 year old African American female with a history of hypertension, diabetes, MI, CVA, was admitted for left lower quadrant pain for 2 weeks, nausea and vomiting for 2 days. CT of abdomen showed distended large bowel loops suspicious for sigmoid volvulus. GI physician did a colonoscopy with complete decompression yesterday. The patient still had the abdominal pain and distention. No bowel movement, but the patient passed gas. The patient denies any fever, chills. No chest pain, shortness of breath or cough.   PAST MEDICAL HISTORY: UTI, pneumonia, hypertension, diabetes, MI, CVA, CAD.   FAMILY HISTORY: Diabetes.   SOCIAL HISTORY: No smoking or drinking or illicit drugs.   PAST SURGICAL HISTORY: PEG tube placement.   ALLERGIES: CONTRAST, LEVAQUIN, MORPHINE, NSAIDS, PENICILLIN.   HOME MEDICATIONS: Zofran ODT 4 mg p.o. 3 times a day p.r.n., Transderm/scop 1.5 mg transdermal film 1, one patch transdermal behind the ear every 3 days, senna 8.6 mg PEG tube 2 tablets once a day, Plavix 75 mg PEG tube once a day, Nitrostat 0.4 mg sublingual 1 tab every 5 minutes p.r.n. for chest pain, metformin 500 mg once a day by PEG, Maalox at once their regular strength 200 mg/200 mg/20 mg per 5 mL suspension 20 mL PEG t.i.d. p.r.n., Lumigan 0.01% ophthalmic solution 1 drop to right eye once a day at bedtime, lorazepam 0.5 mg p.o. 0.5 tablets PEG every 12 hours p.r.n. for anxiety, Lipitor 20 mg p.o. PEG once a day, lactulose 10 grams per 15 mL 30 mL per PEG once a day, Ilevro 0.3% ophthalmic suspension 1 drop to right eye once a day, hydrocortisone topical cream apply topically to affected area twice a day  to affected area behind the ear twice a day, glucose 15 forth percent oral gel 1 dose once p.r.n. for sugar less than 60, Dulcolax laxative 10 mg rectal suppository 1 suppository rectal once a day p.r.n. for constipation, Colace 10 mg/mL oral liquid 20 mL per PEG at bedtime, Celexa 20 mg p.o. tablets 1 tablet PEG once a day, Aspercreme 10% topical lotion apply topically to affected area 4 times a day p.r.n., acetaminophen 500 mg PEG 3 times a day for pain.   REVIEW OF SYSTEMS:  CONSTITUTIONAL: The patient denies any fever or chills. No headache or dizziness, but has generalized weakness.  EYES: No double vision, no blurred vision.  ENT: No postnasal drip. Has chronic slurred speech. No dysphagia, but has generalized weakness. Has slurred speech and dysphagia.  CARDIOVASCULAR: No chest pain, palpitation, orthopnea, nocturnal dyspnea. No leg edema.  PULMONARY: No cough, sputum, shortness of breath or hemoptysis.  GASTROINTESTINAL: Positive for lower abdominal pain, no abdominal distention, nausea. No vomiting or diarrhea. No bowel movement. No melena or bloody stool.  GENITOURINARY: No dysuria, hematuria, or incontinence.  SKIN: No rash or jaundice.  NEUROLOGY: No syncope, loss of consciousness, or seizure.  ENDOCRINOLOGY: No polyuria, polydipsia, heat or cold intolerance.  HEMATOLOGY: No easy bleeding, bleeding.   PHYSICAL EXAMINATION:  VITAL SIGNS: Temperature 99.9, blood pressure 99/65, pulse 100, O2 saturation of 100% on 2 liters oxygen by nasal cannula. GENERAL: The patient is alert, awake, a little bit confused, in no acute distress, very thin.  HEENT: Pupils round, equal, and  reactive to light and accommodation. Moist oral mucosa. Clear oropharynx.  NECK: Supple. No JVD or carotid bruits. No lymphadenopathy. No thyromegaly.  CARDIOVASCULAR: S1, S2 regular rate and rhythm. No murmurs, gallops.  PULMONARY: Bilateral air entry. No wheezing or rales. No use of accessory muscle to breathe.   ABDOMEN: PEG tube in situ. Diffuse tenderness with abdominal distention, unable to estimate whether the patient has organomegaly due to abdominal tenderness. Very weak bowel sounds.  EXTREMITIES: No edema, clubbing, or cyanosis. No calf tenderness.  SKIN: No rash or jaundice, but has a very poor turgor. NEUROLOGY: A and O x 3. No focal deficits. Power for 3-4/5. Sensation intact.   DIAGNOSTIC DATA: Abdominal flat and erect x-ray showed a catheter are present which presumably extending to the level of sigmoid volvulus. WBC 14.2, hemoglobin 10.2, platelets 127,000. Glucose 163, BUN 58, creatinine 1.31. Electrolytes normal. Urinalysis is negative, lipase 600 yesterday.   IMPRESSIONS:  1. Colon volvulus, status post complete decompression.  2. Acute renal failure with dehydration.  3. Leukocytosis.  4. Anemia.  5. Thrombocytopenia.  6. Possible mild pancreatitis.  7. Malnutrition.  8. Hypertension, controlled.  9. Diabetes.  10. History cerebrovascular accident, coronary artery disease.    RECOMMENDATIONS: 1. Keep n.p.o. with IV fluid support, pain control, Zofran p.r.n. Follow up Dr. Juliann PulseLundquist for possible surgery. 2. For acute renal failure with dehydration. Keep n.p.o., give IV fluid support. Follow up BMP.  3. Diabetes. Continue sliding scale, hold metformin.  4. For malnutrition, we will get a dietitian consult. If the patient needs abdominal surgery, patient may need TPN.  5. For anemia and thrombocytopenia, Follow up CBC.   Discussed the patient's condition and recommendations with the patient and the patient's family member, patient wants full code. Also discussed with Dr. Juliann PulseLundquist. He suggested the patient may need surgery.   TIME SPENT: About 62 minutes.     ____________________________ Shaune PollackQing Rolene Andrades, MD qc:lt D: 07/05/2014 16:34:49 ET T: 07/05/2014 17:43:26 ET JOB#: [id number]  cc: Shaune PollackQing Emery Dupuy, MD, <Dictator> Shaune PollackQING Katai Marsico MD ELECTRONICALLY SIGNED 07/06/2014 16:07

## 2015-02-08 NOTE — Consult Note (Signed)
PATIENT NAME:  Whitney Hernandez, Shenise MR#:  [id number] DATE OF BIRTH:  17-Nov-1940  DATE OF CONSULTATION:  12/07/2013  REFERRING PHYSICIAN:  Krystal EatonShayiq Ahmadzia, MD CONSULTING PHYSICIAN:  Stann Mainlandavid P. Sampson GoonFitzgerald, MD  REASON FOR CONSULTATION: Aspiration pneumonia, antibiotic selection.   HISTORY OF PRESENT ILLNESS: This is a 74 year old female with a history of multiple prior CVAs as well as long-standing aspiration, who had a PEG tube in the past due to aspiration. However, that removed about a year and a half ago. She was admitted on February 13 with a temperature of 102.8 and dizziness, vomiting, and nausea. She was found to be hypotensive. She had an elevated creatinine and evidence of a urinary tract infection. She was admitted for UTI, pneumonia, and renal failure and treated with antibiotics. She developed worsening pneumonia, however, likely due to aspiration. She was seen by GI and had PEG placement done today, as she has had a lot of aspiration noted on a modified barium swallow. We are consulted for further antibiotic management.   Currently, the patient and her family report a thick heavy cough. She has tolerated the PEG placement today. She does remain on 2 liters of O2.   PAST MEDICAL HISTORY:  1.  CVAs, initially 2011.  2.  Prior dysphagia with PEG tube placement, but it was removed in 2013 due to site infections.  3.  Coronary artery disease.  4.  Diabetes.  5.  Hypertension.   PAST SURGICAL HISTORY: Placement of PEG tube.   FAMILY HISTORY: Positive for diabetes.   SOCIAL HISTORY: Does not smoke. Does not drink. Currently she lives at home and is able to do many things for herself.   REVIEW OF SYSTEMS: Unable to be obtained due to some dysphagia and aphasia.   ALLERGIES: THE PATIENT IS ALLERGIC TO CONTRAST MEDIA, LEVAQUIN, MORPHINE, NSAIDS. AND PENICILLIN.  ANTIBIOTICS SINCE ADMISSION: Include azithromycin received February 13, 14, 15. Ceftriaxone February 13, 14, 15. Meropenem begun  February 16, continued until today. She is also on methylprednisolone 60 mg q.6 hours.   PHYSICAL EXAMINATION: VITAL SIGNS: T-max 98.1. Pulse is 81, blood pressure 135/67, pulse oximetry 94% on 2 liters. Of note, I see no documented fevers since February 14 when she was 100.9.  GENERAL: She is very thin, lying in bed, recently had a PEG placed.  HEENT: Pupils are equal, round, reactive to light and accommodation. Oropharynx is clear.  NECK: Supple.  HEART: Regular.  LUNGS: Poor breath sounds bilaterally with some mild rhonchi.  ABDOMEN: Soft, nontender. She has a new PEG tube in her left upper quadrant.  EXTREMITIES: She has no clubbing, cyanosis, or edema.  NEUROLOGIC: She is very soft-spoken, difficult to understand. Able to move all 4 extremities.   LABORATORY AND RADIOLOGICAL DATA:  CT of her chest done February 18 shows extensive new consolidative infiltrate throughout the right lower lobe, as well as extensive infiltrate in the right upper lobe and right middle lobe. There is also new consolidative infiltrate in the left lower lobe. There is a parapneumonic effusion, right greater than left. Initially chest x-ray done February 16 showed worsening airspace disease as well. CT of her abdomen at that time, however, did not show much in the right lower lobe, just a lingular pneumonia.   White blood count on admission was 9.7; currently it is 8.1. Hemoglobin 9.1, platelets 288. Renal function shows a creatinine of 1.43, BUN of 65, estimated GFR of 42. Urinalysis on admission showed 1492 white cells, grew greater than 100,000 group B  strep. Blood cultures x 2 February 13 are negative.   IMPRESSION: A 74 year old initially admitted February 13 with pneumonia/urosepsis. She then developed worsening aspiration and is now status post percutaneous endoscopic gastrostomy tube placement.   RECOMMENDATIONS:  1.  Change meropenem to clindamycin 300 mg 3 times a day to finish a 10-day course of treatment.   SHE IS ALLERGIC TO MULTIPLE ANTIBIOTICS INCLUDING LEVOFLOXACIN AND PENICILLIN, so Augmentin and moxifloxacin are not great options.   2.  The clindamycin will also treat the group B strep urinary tract infection, which seems to be her initial presenting problem.   3.  I did discuss with the family that PEG tube placement, while providing good nutrition, will not necessarily decrease the risk of aspiration.   4.  If the patient is able to produce sample, I would suggest we culture it in order to see if there are any resistant organisms, as this did seem to develop while she was in the hospital.   Thank you for the consult. Please call with questions.   ____________________________ Stann Mainland. Sampson Goon, MD dpf:jcm D: 12/07/2013 14:52:51 ET T: 12/07/2013 15:23:21 ET JOB#: [id number]  cc: Stann Mainland. Sampson Goon, MD, <Dictator> Brynn Reznik Sampson Goon MD ELECTRONICALLY SIGNED 12/12/2013 23:23

## 2015-02-08 NOTE — Consult Note (Signed)
Dr.Thomas evaluated patient for anesthesia for PEG placement. Felt due to bilateral pneumonia that patient high risk for complications with procedure. Therefore, Dr. Maisie Fushomas discussed with daughter and cancelled the procedure. See if we can improve pt's respiratory status. If her condition improves, call us back, and we will proceed with PEG placement later. Thanks.  Electronic Signatures: Lutricia Feilh, Thao Vanover (MD)  (Signed on 20-Feb-15 11:14)  Authored  Last Updated: 20-Feb-15 11:14 by Lutricia Feilh, Kathan Kirker (MD)

## 2015-02-08 NOTE — Consult Note (Signed)
After discussion with hospitalist, anesthesia decided to proceed with PEG since resp status will not improve anymore. G tube successfully placed at previous G site. This was the only site where there was good one to one pressure with finger. There was no transillumination of light. 20 Fr placed. Insertion site at 2 cm. Can use G tube for flushes/meds today. If stable, can start TF tomorrow. Keep area clean with either betadine or hydrogen peroxide. Thanks.  Electronic Signatures: Lutricia Feilh, Cora Stetson (MD)  (Signed on 20-Feb-15 13:20)  Authored  Last Updated: 20-Feb-15 13:20 by Lutricia Feilh, Finnbar Cedillos (MD)

## 2015-02-08 NOTE — Consult Note (Signed)
PATIENT NAME:  Whitney Hernandez, Whitney Hernandez MR#:  [id number] DATE OF BIRTH:  11/19/1940  DATE OF CONSULTATION:  12/05/2013  REFERRING PHYSICIAN:  Dr. Nemiah Commander CONSULTING PHYSICIAN:  Rodman Key, NP and Dr. Lutricia Feil.  REASON FOR CONSULTATION: PEG tube placement.   HISTORY OF PRESENT ILLNESS: [name] is a 74 year old African American female who lives at home with her friend and goes to [employer] program on a daily basis. History is obtained by H and P, the patient as well as her friend, who is present during interviewing process. The patient was brought to the hospital on November 30, 2013. On that date, apparently both of her eyes became blurred, double vision and slurred speech. She was noted to be slurred by her family members. Some dizziness for the past week, nausea and vomiting as well as intermittently before that the patient had a lot of oral secretions as well. She had been on a pureed diet and drinking mostly thickened Ensure. Sometimes she would drink water per her daughter. Her medical history is significant for CVA in 2011 and underwent a PEG tube placement with removal in 2014. A friend, who is present during interviewing process, states that at the time of PEG tube being removed that it was infected. She has been very leery about having a feeding tube put back in.   In the Emergency Room, she was found to have a temperature of 102.8. Blood pressure was 86/57. She was found to have a significant urinary tract infection as well as being dehydrated with a creatinine of 2.2. Chest x-ray showed linear pneumonia on the left and she was admitted for SIRS secondary to UTI, pneumonia, renal failure and increased slurred speech underlying new CVA versus TIA. The patient was intubated and had been on the ventilator assistance up until yesterday the 17th when she was on a nonrebreather in the morning and was hypoxic again last night and then she was able to be weaned to 6 L. She did have a modified barium swallow  study done, which she failed. It demonstrated increased risk for aspiration and thus GI consultation was placed for PEG tube placement. The patient denies any nausea at this time as long as scopolamine patch is in place. Denies any pain anywhere. Speech remained slurred.   PAST MEDICAL HISTORY: CVA, hypertension, diabetes, myocardial infarction,  stroke in 2011, underwent a PEG tube placement with removal, this states 2013 on H and P but friend says last year 2014 and history of coronary artery disease.   ALLERGIES: CONTRAST MEDIA, LEVAQUIN, MORPHINE, NSAIDS AND PENICILLIN.   FAMILY HISTORY: Two sisters with history of diabetes.   SOCIAL HISTORY: No tobacco. No alcohol use.   HOME MEDICATIONS: Zovirax 5% ointment 4 times daily as needed, Zofran ODT 4 mg 1 tablet by mouth t.i.d. as needed, Aspercreme 10% apply to affected area 4 times a day as needed, Ensure liquid twice a day, hydrocortisone 1% cream applied to soles of both feet b.i.d., Lumigan 0.01 solution 1 drop in the right eye each night, glutose 40% gel for low blood sugar, carvedilol 3.125 mg tablet daily, Senokot 8.6/50, 2 tablets b.i.d., Dulcolax 10 mg rectal as needed, Ensure plus liquid 4 times a day, GlycoLax powder  b.i.d. to t.i.d., Nitrostat 0.4 mL sublingual as needed, Lipitor 20 mg a day, Prevacid SolTab 30 mg once a day, Sanctura 20 mg b.i.d., alprazolam 0.25 mg p.o. every 8 hours as needed, transderm scopolamine 1.5 mg one patch behind ear every 3 days, Celexa 20  mg a day, Plavix 75 mg daily, Lasix 20 mg daily and docusate 150 mg daily.   REVIEW OF SYSTEMS: Unable to obtain. The patient is not able to speak clearly, very soft spoken with slurred speech.   PHYSICAL EXAMINATION:  VITAL SIGNS: Temperature 97.4 with a pulse of 86, respirations 20, blood pressure 127/66, pulse oximetry is 97% on room air.  GENERAL: Well developed, slender, malnourished, 74 year old, African American female.  HEENT: Normocephalic, atraumatic. Pupils  equal and reactive to light. Conjunctivae clear. Sclerae anicteric.  NECK: Supple. Trachea midline. No lymphadenopathy or thyromegaly.  PULMONARY: Decreased breath sounds noted throughout. Scattered rhonchi auscultated anteriorly. Poor respiratory effort.  CARDIOVASCULAR: Regular rhythm, S1, S2. No murmurs, no gallops.  ABDOMEN: Soft, nondistended. Bowel sounds in 4 quadrants. No bruits. No masses. No evidence of hepatosplenomegaly.  RECTAL: Deferred.  MUSCULOSKELETAL: Gait not assessed.  EXTREMITIES: No edema.  PSYCHIATRIC: Alert and oriented x 2.  SKIN: Warm and dry.  NEUROLOGICAL: Limited again due to patient's understanding and cooperation. She is right-handed. No focal neurological deficits noted.   LABORATORY/DIAGNOSTIC: All laboratory/diagnostic studies reviewed by myself since her admission.   IMPRESSION: Aspiration. Admitted with suspected aspiration pneumonia. Systemic inflammatory response syndrome  secondary to pneumonia and urinary tract infection. Respiratory failure. Known history of CVA in 2011. Failure to thrive.   PLAN: The patient's presentation was discussed with Dr. Lutricia FeilPaul Oh as well as the patient was seen by Dr. Bluford Kaufmannh. Plan is to proceed forward with gastrostomy tube placement tomorrow. I have spoken with Amy [place], daughter who does have POA for the patient.  She will come today and sign consent. The patient will be n.p.o. after midnight. Order written to hold aspirin and heparin. She will continue on antibiotic therapy as ordered.  ____________________________ Rodman Keyawn S. Harrison, NP dsh:aw D: 12/05/2013 14:24:30 ET T: 12/05/2013 14:54:10 ET JOB#: [id number]  cc: Rodman Keyawn S. Harrison, NP, <Dictator> Rodman KeyAWN S HARRISON MD ELECTRONICALLY SIGNED 12/07/2013 7:47

## 2015-03-19 DEATH — deceased
